# Patient Record
Sex: Female | Born: 1950 | Race: Black or African American | Hispanic: No | Marital: Single | Smoking: Never smoker
Health system: Southern US, Community
[De-identification: ages and names within clinical notes are randomized; demographics above are authoritative.]

## PROBLEM LIST (undated history)

## (undated) DIAGNOSIS — Z9889 Other specified postprocedural states: Secondary | ICD-10-CM

## (undated) DIAGNOSIS — F419 Anxiety disorder, unspecified: Secondary | ICD-10-CM

## (undated) DIAGNOSIS — F32A Depression, unspecified: Secondary | ICD-10-CM

## (undated) DIAGNOSIS — R011 Cardiac murmur, unspecified: Secondary | ICD-10-CM

## (undated) DIAGNOSIS — F329 Major depressive disorder, single episode, unspecified: Secondary | ICD-10-CM

## (undated) DIAGNOSIS — E785 Hyperlipidemia, unspecified: Secondary | ICD-10-CM

## (undated) DIAGNOSIS — E669 Obesity, unspecified: Secondary | ICD-10-CM

## (undated) DIAGNOSIS — I10 Essential (primary) hypertension: Secondary | ICD-10-CM

## (undated) DIAGNOSIS — R112 Nausea with vomiting, unspecified: Secondary | ICD-10-CM

## (undated) DIAGNOSIS — E7800 Pure hypercholesterolemia, unspecified: Secondary | ICD-10-CM

## (undated) DIAGNOSIS — M25562 Pain in left knee: Secondary | ICD-10-CM

## (undated) DIAGNOSIS — M722 Plantar fascial fibromatosis: Secondary | ICD-10-CM

## (undated) DIAGNOSIS — M1990 Unspecified osteoarthritis, unspecified site: Secondary | ICD-10-CM

## (undated) DIAGNOSIS — M25561 Pain in right knee: Secondary | ICD-10-CM

## (undated) HISTORY — DX: Obesity, unspecified: E66.9

## (undated) HISTORY — DX: Essential (primary) hypertension: I10

## (undated) HISTORY — DX: Pain in right knee: M25.562

## (undated) HISTORY — DX: Pure hypercholesterolemia, unspecified: E78.00

## (undated) HISTORY — DX: Major depressive disorder, single episode, unspecified: F32.9

## (undated) HISTORY — DX: Plantar fascial fibromatosis: M72.2

## (undated) HISTORY — DX: Hyperlipidemia, unspecified: E78.5

## (undated) HISTORY — DX: Pain in right knee: M25.561

---

## 1998-10-20 ENCOUNTER — Emergency Department (HOSPITAL_COMMUNITY): Admission: EM | Admit: 1998-10-20 | Discharge: 1998-10-20 | Payer: Self-pay | Admitting: Emergency Medicine

## 2007-04-13 ENCOUNTER — Encounter: Admission: RE | Admit: 2007-04-13 | Discharge: 2007-04-14 | Payer: Self-pay | Admitting: Family Medicine

## 2013-07-13 ENCOUNTER — Ambulatory Visit (INDEPENDENT_AMBULATORY_CARE_PROVIDER_SITE_OTHER): Payer: 59

## 2013-07-13 VITALS — BP 137/75 | HR 68 | Resp 16 | Ht 65.0 in | Wt 220.0 lb

## 2013-07-13 DIAGNOSIS — M779 Enthesopathy, unspecified: Secondary | ICD-10-CM

## 2013-07-13 DIAGNOSIS — M79673 Pain in unspecified foot: Secondary | ICD-10-CM

## 2013-07-13 DIAGNOSIS — M722 Plantar fascial fibromatosis: Secondary | ICD-10-CM

## 2013-07-13 DIAGNOSIS — M79609 Pain in unspecified limb: Secondary | ICD-10-CM

## 2013-07-13 DIAGNOSIS — M2040 Other hammer toe(s) (acquired), unspecified foot: Secondary | ICD-10-CM

## 2013-07-13 DIAGNOSIS — B351 Tinea unguium: Secondary | ICD-10-CM

## 2013-07-13 MED ORDER — EFINACONAZOLE 10 % EX SOLN: CUTANEOUS | Status: DC

## 2013-07-13 NOTE — Progress Notes (Signed)
   Subjective:    Patient ID: Tammy [name], female    DOB: 04/02/1951, 63 y.o.   MRN: [id number]  HPI Comments: "My feet are all messed up"  Patient c/o painful feet bilateral for about 1 year. Her 2nd toes bilateral are curling under and developing callused areas at tip. She keeps 2nd toe right covered with band aids for cushion. She has some callused areas plantar forefoot bilateral. She is concerned about the 2nd toenail left being thick and discolored. Her shoes are uncomfortable. Just needs some recommendations on what she can do.  Foot Pain Associated symptoms include arthralgias and myalgias.      Review of Systems  Musculoskeletal: Positive for arthralgias, gait problem and myalgias.  All other systems reviewed and are negative.       Objective:   Physical Exam Vascular status is intact as follows pedal pulses palpable DP plus one over 4 PT +2/4 bilateral Refill time 3 seconds all digits skin temperature is warm turgor normal no edema rubor pallor or varicosities noted. Dermatologically skin color pigment normal hair growth absent nails are thickened yellow brittle discolored the second toe right has keratosis over the dorsal IP joint due to hammertoe contracture all nails are thickened yellow brittle crumbly nails consistent with onychomycosis also nails are hitting the ends of her shoes she wear shoes 2 short. Recommended a size larger shoe at all times. Neurologically epicritic and proprioceptive sensations intact there may be some pain on direct lateral compression left forefoot cannot rule out some possibly early neuroma situation she is wearing shoes are too tight or compressive versus osteoarthropathy capsulitis. Patient also may have some mild plantar fascial symptomology although not constant. Does have some pain on first up in the morning. Her examination this time reveals patient shoes are not correctly sized and may be causing significant pain discomfort. 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     Assessment & Plan:  Assessment this time onychomycosis nails painful tender symptomatically mycotic nails debrided 1 through 4 bilateral at this time. Recommended topical antifungal a prescription for Tammy [name] is forwarded to the underlying pharmacy. Patient will initiate topical antifungal once daily to the affected nails for 12 months duration at this time to foam padding is applied to the second toe right foot and additional pads are dispensed for use did demonstrate the patient her shoes are 2 short at her toes are hitting the ends the shoes and buckling as result. Recommended knee shoes she may come back and obtain soap extra-depth shoes and she does have significant arthropathy and might benefit from shoes and possibly the customized insoles in the future. May be candidate for functional orthoses as well schedule followup with the next 4 week next  Tammy [name] DPM

## 2013-07-13 NOTE — Patient Instructions (Signed)
Onychomycosis/Fungal Toenails  WHAT IS IT? An infection that lies within the keratin of your nail plate that is caused by a fungus.  WHY ME? Fungal infections affect all ages, sexes, races, and creeds.  There may be many factors that predispose you to a fungal infection such as age, coexisting medical conditions such as diabetes, or an autoimmune disease; stress, medications, fatigue, genetics, etc.  Bottom line: fungus thrives in a warm, moist environment and your shoes offer such a location.  IS IT CONTAGIOUS? Theoretically, yes.  You do not want to share shoes, nail clippers or files with someone who has fungal toenails.  Walking around barefoot in the same room or sleeping in the same bed is unlikely to transfer the organism.  It is important to realize, however, that fungus can spread easily from one nail to the next on the same foot.  HOW DO WE TREAT THIS?  There are several ways to treat this condition.  Treatment may depend on many factors such as age, medications, pregnancy, liver and kidney conditions, etc.  It is best to ask your doctor which options are available to you.  1. No treatment.   Unlike many other medical concerns, you can live with this condition.  However for many people this can be a painful condition and may lead to ingrown toenails or a bacterial infection.  It is recommended that you keep the nails cut short to help reduce the amount of fungal nail. 2. Topical treatment.  These range from herbal remedies to prescription strength nail lacquers.  About 40-50% effective, topicals require twice daily application for approximately 9 to 12 months or until an entirely new nail has grown out.  The most effective topicals are medical grade medications available through physicians offices. 3. Oral antifungal medications.  With an 80-90% cure rate, the most common oral medication requires 3 to 4 months of therapy and stays in your system for a year as the new nail grows out.  Oral  antifungal medications do require blood work to make sure it is a safe drug for you.  A liver function panel will be performed prior to starting the medication and after the first month of treatment.  It is important to have the blood work performed to avoid any harmful side effects.  In general, this medication safe but blood work is required. 4. Laser Therapy.  This treatment is performed by applying a specialized laser to the affected nail plate.  This therapy is noninvasive, fast, and non-painful.  It is not covered by insurance and is therefore, out of pocket.  The results have been very good with a 80-95% cure rate.  The [facility] is the only practice in the area to offer this therapy. 5. Permanent Nail Avulsion.  Removing the entire nail so that a new nail will not grow back.  Your prescription for topical nail antifungal has been forwarded to the underlying pharmacy will be sent T. directly follow the instructions on the card that was given to call the pharmacy number. Apply medication to your toes every day for 12 months duration to  Also followup and get appropriate size shoes comes with longer than your longest toe avoid a tight or restrictive are constricted shoe

## 2013-08-10 ENCOUNTER — Ambulatory Visit: Payer: 59

## 2013-08-13 ENCOUNTER — Ambulatory Visit: Payer: 59

## 2013-12-06 ENCOUNTER — Other Ambulatory Visit: Payer: Self-pay | Admitting: Orthopedic Surgery

## 2013-12-06 NOTE — Progress Notes (Signed)
Preoperative surgical orders have been place into the Epic hospital system for Tammy [name] on 12/06/2013, 10:41 AM  by Patrica DuelPERKINS, Shafiq Larch for surgery on 01/16/2014.  Preop Total Hip - Anterior Approach orders including Experel Injecion, IV Tylenol, and IV Decadron as long as there are no contraindications to the above medications. Avel Peacerew Rickeya Manus, PA-C

## 2014-01-02 ENCOUNTER — Encounter (HOSPITAL_COMMUNITY): Payer: Self-pay | Admitting: Pharmacy Technician

## 2014-01-09 ENCOUNTER — Encounter (INDEPENDENT_AMBULATORY_CARE_PROVIDER_SITE_OTHER): Payer: Self-pay

## 2014-01-09 ENCOUNTER — Encounter (HOSPITAL_COMMUNITY): Payer: Self-pay

## 2014-01-09 ENCOUNTER — Ambulatory Visit (HOSPITAL_COMMUNITY)
Admission: RE | Admit: 2014-01-09 | Discharge: 2014-01-09 | Disposition: A | Discharge status: Home / Self Care | Payer: 59 | Source: Ambulatory Visit | Attending: Orthopedic Surgery | Admitting: Orthopedic Surgery

## 2014-01-09 ENCOUNTER — Encounter (HOSPITAL_COMMUNITY)
Admission: RE | Admit: 2014-01-09 | Discharge: 2014-01-09 | Disposition: A | Discharge status: Home / Self Care | Payer: 59 | Source: Ambulatory Visit | Attending: Orthopedic Surgery | Admitting: Orthopedic Surgery

## 2014-01-09 DIAGNOSIS — M169 Osteoarthritis of hip, unspecified: Secondary | ICD-10-CM | POA: Diagnosis not present

## 2014-01-09 DIAGNOSIS — Z01818 Encounter for other preprocedural examination: Secondary | ICD-10-CM | POA: Insufficient documentation

## 2014-01-09 DIAGNOSIS — M1610 Unilateral primary osteoarthritis, unspecified hip: Secondary | ICD-10-CM | POA: Diagnosis not present

## 2014-01-09 DIAGNOSIS — I10 Essential (primary) hypertension: Secondary | ICD-10-CM | POA: Diagnosis not present

## 2014-01-09 HISTORY — DX: Unspecified osteoarthritis, unspecified site: M19.90

## 2014-01-09 HISTORY — DX: Cardiac murmur, unspecified: R01.1

## 2014-01-09 HISTORY — DX: Major depressive disorder, single episode, unspecified: F32.9

## 2014-01-09 HISTORY — DX: Depression, unspecified: F32.A

## 2014-01-09 LAB — CBC
HEMATOCRIT: 38.9 % (ref 36.0–46.0)
HEMOGLOBIN: 12.9 g/dL (ref 12.0–15.0)
MCH: 29.3 pg (ref 26.0–34.0)
MCHC: 33.2 g/dL (ref 30.0–36.0)
MCV: 88.4 fL (ref 78.0–100.0)
Platelets: 352 10*3/uL (ref 150–400)
RBC: 4.4 MIL/uL (ref 3.87–5.11)
RDW: 13.9 % (ref 11.5–15.5)
WBC: 14 10*3/uL — ABNORMAL HIGH (ref 4.0–10.5)

## 2014-01-09 LAB — SURGICAL PCR SCREEN
MRSA, PCR: NEGATIVE
Staphylococcus aureus: NEGATIVE

## 2014-01-09 LAB — COMPREHENSIVE METABOLIC PANEL
ALK PHOS: 117 U/L (ref 39–117)
ALT: 20 U/L (ref 0–35)
ANION GAP: 14 (ref 5–15)
AST: 24 U/L (ref 0–37)
Albumin: 4.1 g/dL (ref 3.5–5.2)
BILIRUBIN TOTAL: 0.8 mg/dL (ref 0.3–1.2)
BUN: 14 mg/dL (ref 6–23)
CO2: 26 mEq/L (ref 19–32)
CREATININE: 0.71 mg/dL (ref 0.50–1.10)
Calcium: 9.7 mg/dL (ref 8.4–10.5)
Chloride: 96 mEq/L (ref 96–112)
GFR calc non Af Amer: >90 mL/min (ref >90)
GLUCOSE: 87 mg/dL (ref 70–99)
POTASSIUM: 3.2 meq/L — AB (ref 3.7–5.3)
Sodium: 136 mEq/L — ABNORMAL LOW (ref 137–147)
TOTAL PROTEIN: 7.9 g/dL (ref 6.0–8.3)

## 2014-01-09 LAB — URINALYSIS, ROUTINE W REFLEX MICROSCOPIC
Glucose, UA: NEGATIVE mg/dL
HGB URINE DIPSTICK: NEGATIVE
Ketones, ur: NEGATIVE mg/dL
Nitrite: NEGATIVE
PROTEIN: NEGATIVE mg/dL
Specific Gravity, Urine: 1.024 (ref 1.005–1.030)
UROBILINOGEN UA: 0.2 mg/dL (ref 0.0–1.0)
pH: 5.5 (ref 5.0–8.0)

## 2014-01-09 LAB — APTT: APTT: 40 s — AB (ref 24–37)

## 2014-01-09 LAB — URINE MICROSCOPIC-ADD ON

## 2014-01-09 LAB — PROTIME-INR
INR: 1.08 (ref 0.00–1.49)
PROTHROMBIN TIME: 14 s (ref 11.6–15.2)

## 2014-01-09 NOTE — Patient Instructions (Addendum)
20 Tammy [name]  01/09/2014   Your procedure is scheduled on:   9-16 -2015  Report to [facility] and follow signs to  Short Stay Center at AM. Arrive at 7:45 AM  Call this number if you have problems the morning of surgery [phone] or Presurgical Testing [phone].   Remember:  Do not eat food or drink liquids :After Midnight.  For Living Will and/or Health Care Power Attorney Forms: please provide copy for your medical record, may bring AM of surgery (forms should be already notarized-we do not provide this service).    Take these medicines the morning of surgery with A SIP OF WATER: Metoprolol,Tramadol if needed for pain,Plendil                               You may not have any metal on your body including hair pins and piercings  Do not wear jewelry, make-up, lotions, powders, or deodorant.  Do not shave body hair  48 hours(2 days) of CHG soap use. Men may shave face and neck.               Do not bring valuables to the hospital. [facility] IS NOT RESPONSIBLE FOR VALUABLES.  Contacts, dentures or bridgework may not be worn into surgery.  Leave suitcase in the car. After surgery it may be brought to your room.  For patients admitted to the hospital, checkout time is 11:00 AM the day of discharge.     Special Instructions: review fact sheets for MRSA information, Blood Transfusion fact sheet, Incentive Spirometry.  Remember: Type/Screen "Blue armsbands"- may not be removed once applied(would result in being retested AM of surgery, if removed). ________________________________________________________________________  [facility] - Preparing for Surgery Before surgery, you can play an important role.  Because skin is not sterile, your skin needs to be as free of germs as possible.  You can reduce the number of germs on your skin by washing with CHG (chlorahexidine gluconate) soap before surgery.  CHG is an antiseptic cleaner which kills germs and bonds  with the skin to continue killing germs even after washing. Please DO NOT use if you have an allergy to CHG or antibacterial soaps.  If your skin becomes reddened/irritated stop using the CHG and inform your nurse when you arrive at Short Stay. Do not shave (including legs and underarms) for at least 48 hours prior to the first CHG shower.  You may shave your face/neck. Please follow these instructions carefully:  1.  Shower with CHG Soap the night before surgery and the  morning of Surgery.  2.  If you choose to wash your hair, wash your hair first as usual with your  normal  shampoo.  3.  After you shampoo, rinse your hair and body thoroughly to remove the  shampoo.                           4.  Use CHG as you would any other liquid soap.  You can apply chg directly  to the skin and wash                       Gently with a scrungie or clean washcloth.  5.  Apply the CHG Soap to your body ONLY FROM THE NECK DOWN.   Do not use on face/ open  Wound or open sores. Avoid contact with eyes, ears mouth and genitals (private parts).                       Wash face,  Genitals (private parts) with your normal soap.             6.  Wash thoroughly, paying special attention to the area where your surgery  will be performed.  7.  Thoroughly rinse your body with warm water from the neck down.  8.  DO NOT shower/wash with your normal soap after using and rinsing off  the CHG Soap.                9.  Pat yourself dry with a clean towel.            10.  Wear clean pajamas.            11.  Place clean sheets on your bed the night of your first shower and do not  sleep with pets. Day of Surgery : Do not apply any lotions/deodorants the morning of surgery.  Please wear clean clothes to the hospital/surgery center.  FAILURE TO FOLLOW THESE INSTRUCTIONS MAY RESULT IN THE CANCELLATION OF YOUR SURGERY PATIENT SIGNATURE_________________________________  NURSE  SIGNATURE__________________________________  ________________________________________________________________________   Adam Phenix  An incentive spirometer is a tool that can help keep your lungs clear and active. This tool measures how well you are filling your lungs with each breath. Taking long deep breaths may help reverse or decrease the chance of developing breathing (pulmonary) problems (especially infection) following:  A long period of time when you are unable to move or be active. BEFORE THE PROCEDURE   If the spirometer includes an indicator to show your best effort, your nurse or respiratory therapist will set it to a desired goal.  If possible, sit up straight or lean slightly forward. Try not to slouch.  Hold the incentive spirometer in an upright position. INSTRUCTIONS FOR USE  1. Sit on the edge of your bed if possible, or sit up as far as you can in bed or on a chair. 2. Hold the incentive spirometer in an upright position. 3. Breathe out normally. 4. Place the mouthpiece in your mouth and seal your lips tightly around it. 5. Breathe in slowly and as deeply as possible, raising the piston or the ball toward the top of the column. 6. Hold your breath for 3-5 seconds or for as long as possible. Allow the piston or ball to fall to the bottom of the column. 7. Remove the mouthpiece from your mouth and breathe out normally. 8. Rest for a few seconds and repeat Steps 1 through 7 at least 10 times every 1-2 hours when you are awake. Take your time and take a few normal breaths between deep breaths. 9. The spirometer may include an indicator to show your best effort. Use the indicator as a goal to work toward during each repetition. 10. After each set of 10 deep breaths, practice coughing to be sure your lungs are clear. If you have an incision (the cut made at the time of surgery), support your incision when coughing by placing a pillow or rolled up towels firmly  against it. Once you are able to get out of bed, walk around indoors and cough well. You may stop using the incentive spirometer when instructed by your caregiver.  RISKS AND COMPLICATIONS  Take your time so you do not  get dizzy or light-headed.  If you are in pain, you may need to take or ask for pain medication before doing incentive spirometry. It is harder to take a deep breath if you are having pain. AFTER USE  Rest and breathe slowly and easily.  It can be helpful to keep track of a log of your progress. Your caregiver can provide you with a simple table to help with this. If you are using the spirometer at home, follow these instructions: [place] IF:   You are having difficultly using the spirometer.  You have trouble using the spirometer as often as instructed.  Your pain medication is not giving enough relief while using the spirometer.  You develop fever of 100.5 F (38.1 C) or higher. SEEK IMMEDIATE MEDICAL CARE IF:   You cough up bloody sputum that had not been present before.  You develop fever of 102 F (38.9 C) or greater.  You develop worsening pain at or near the incision site. MAKE SURE YOU:   Understand these instructions.  Will watch your condition.  Will get help right away if you are not doing well or get worse. Document Released: 08/30/2006 Document Revised: 07/12/2011 Document Reviewed: 10/31/2006 Exit[employer] Patient Information 2014 [employer], [place].   ________________________________________________________________________  WHAT IS A BLOOD TRANSFUSION? Blood Transfusion Information  A transfusion is the replacement of blood or some of its parts. Blood is made up of multiple cells which provide different functions.  Red blood cells carry oxygen and are used for blood loss replacement.  White blood cells fight against infection.  Platelets control bleeding.  Plasma helps clot blood.  Other blood products are available for  specialized needs, such as hemophilia or other clotting disorders. BEFORE THE TRANSFUSION  Who gives blood for transfusions?   Healthy volunteers who are fully evaluated to make sure their blood is safe. This is blood bank blood. Transfusion therapy is the safest it has ever been in the practice of medicine. Before blood is taken from a donor, a complete history is taken to make sure that person has no history of diseases nor engages in risky social behavior (examples are intravenous drug use or sexual activity with multiple partners). The donor's travel history is screened to minimize risk of transmitting infections, such as malaria. The donated blood is tested for signs of infectious diseases, such as HIV and hepatitis. The blood is then tested to be sure it is compatible with you in order to minimize the chance of a transfusion reaction. If you or a relative donates blood, this is often done in anticipation of surgery and is not appropriate for emergency situations. It takes many days to process the donated blood. RISKS AND COMPLICATIONS Although transfusion therapy is very safe and saves many lives, the main dangers of transfusion include:   Getting an infectious disease.  Developing a transfusion reaction. This is an allergic reaction to something in the blood you were given. Every precaution is taken to prevent this. The decision to have a blood transfusion has been considered carefully by your caregiver before blood is given. Blood is not given unless the benefits outweigh the risks. AFTER THE TRANSFUSION  Right after receiving a blood transfusion, you will usually feel much better and more energetic. This is especially true if your red blood cells have gotten low (anemic). The transfusion raises the level of the red blood cells which carry oxygen, and this usually causes an energy increase.  The nurse administering the transfusion will  monitor you carefully for complications. HOME CARE  INSTRUCTIONS  No special instructions are needed after a transfusion. You may find your energy is better. Speak with your caregiver about any limitations on activity for underlying diseases you may have. SEEK MEDICAL CARE IF:   Your condition is not improving after your transfusion.  You develop redness or irritation at the intravenous (IV) site. SEEK IMMEDIATE MEDICAL CARE IF:  Any of the following symptoms occur over the next 12 hours:  Shaking chills.  You have a temperature by mouth above 102 F (38.9 C), not controlled by medicine.  Chest, back, or muscle pain.  People around you feel you are not acting correctly or are confused.  Shortness of breath or difficulty breathing.  Dizziness and fainting.  You get a rash or develop hives.  You have a decrease in urine output.  Your urine turns a dark color or changes to pink, red, or brown. Any of the following symptoms occur over the next 10 days:  You have a temperature by mouth above 102 F (38.9 C), not controlled by medicine.  Shortness of breath.  Weakness after normal activity.  The white part of the eye turns yellow (jaundice).  You have a decrease in the amount of urine or are urinating less often.  Your urine turns a dark color or changes to pink, red, or brown. Document Released: 04/16/2000 Document Revised: 07/12/2011 Document Reviewed: 12/04/2007 [facility] Patient Information 2014 [place], [place].  _______________________________________________________________________

## 2014-01-10 NOTE — Pre-Procedure Instructions (Signed)
01-10-14 Labs viewable of 01-09-14 Epic-please note WBC, Potassium, urinalysis. Faxed note to Dr. Deri Fuelling office [phone].

## 2014-01-15 ENCOUNTER — Other Ambulatory Visit: Payer: Self-pay | Admitting: Orthopedic Surgery

## 2014-01-15 NOTE — H&P (Signed)
Efraim Kaufmann DOB: [birth year] Divorced / Language: [place] / Race: Black or African American, White Female Date of Admission:  01/16/2014 Chief Complaint:  Left Hip pain History of Present Illness The patient is a 63 year old female who comes in  for a preoperative history and physical. The patient is scheduled for a left total hip arthroplasty (anterior approach) to be performed by Dr. Gus Rankin. Aluisio, MD at [facility] on 01/16/2014. The patient is a 63 year old female who presents for follow up of their hip. The patient is being followed for their left hip pain and osteoarthritis. They are several months out from intra-articular injection. Symptoms reported today include: pain, aching and difficulty ambulating. and report their pain level to be moderate. Current treatment includes: NSAIDs (Mobic). The patient has reported improvement of their symptoms with: Cortisone injections. Unfortunately her hip is getting progressively worse. She has pain at all times. It limits what she can and cannot do. She had been compensating well until recently. She is at a stage now where she is ready to go ahead and get the left hip fixed. The injection did relieve some of the intense pain, but the pain is still present at all times. She is ready to proceed with hip surgery. They have been treated conservatively in the past for the above stated problem and despite conservative measures, they continue to have progressive pain and severe functional limitations and dysfunction. They have failed non-operative management including home exercise, medications, and injections. It is felt that they would benefit from undergoing total joint replacement. Risks and benefits of the procedure have been discussed with the patient and they elect to proceed with surgery. There are no active contraindications to surgery such as ongoing infection or rapidly progressive neurological disease.  Problem List/Past Medical  Hip  osteoarthritis (715.95  M16.9) High blood pressure Hypercholesterolemia Anxiety Disorder Depression Heart murmur  Allergies  No Known Drug Allergies  Family History Cancer Maternal Grandfather, Mother. Cerebrovascular Accident Maternal Grandmother. Diabetes Mellitus Sister. Hypertension Maternal Grandmother, Sister.  Social History Living situation live alone Marital status divorced Children 2 Exercise Exercises never Current work status working full time No history of drug/alcohol rehab Tobacco use Never smoker. 05/17/2013 Number of flights of stairs before winded 2-3 Never consumed alcohol 05/17/2013: Never consumed alcohol Not under pain contract Post-Surgical Plans Home versus SNF depending upon her progress.  Medication History TraMADol HCl (  Tablet, 1-2 Tablet Oral every 6-8 hours as needed for pain, Taken starting 12/25/2013) Active. Naproxen Sodium (  Tablet, Oral) Active. (bid) Metoprolol Succinate ER (  Tablet ER 24HR, Oral) Active. Felodipine ER (  Tablet ER 24HR, Oral) Active. Atorvastatin Calcium (  Tablet, Oral) Active. Lexapro (  Tablet, Oral) Active. Lisinopril-Hydrochlorothiazide (20-12.5MG  Tablet, Oral) Active. (BID) Aspirin EC (  Tablet DR, Oral) Active.   Past Surgical History No pertinent past surgical history   Review of Systems General Not Present- Chills, Fatigue, Fever, Memory Loss, Night Sweats, Weight Gain and Weight Loss. Skin Not Present- Eczema, Hives, Itching, Lesions and Rash. HEENT Not Present- Dentures, Double Vision, Headache, Hearing Loss, Tinnitus and Visual Loss. Respiratory Not Present- Allergies, Chronic Cough, Coughing up blood, Shortness of breath at rest and Shortness of breath with exertion. Cardiovascular Not Present- Chest Pain, Difficulty Breathing Lying Down, Murmur, Palpitations, Racing/skipping heartbeats and Swelling. Gastrointestinal Not Present- Abdominal Pain,  Bloody Stool, Constipation, Diarrhea, Difficulty Swallowing, Heartburn, Jaundice, Loss of appetitie, Nausea and Vomiting. Female Genitourinary Not Present- Blood in Urine, Discharge, Flank Pain, Incontinence, Painful Urination, Urgency, Urinary  frequency, Urinary Retention, Urinating at Night and Weak urinary stream. Musculoskeletal Present- Back Pain, Morning Stiffness, Muscle Pain and Spasms. Not Present- Joint Pain, Joint Swelling and Muscle Weakness. Neurological Not Present- Blackout spells, Difficulty with balance, Dizziness, Paralysis, Tremor and Weakness. Psychiatric Not Present- Insomnia.   Vitals Pulse: 84 (Regular)  Resp.: 16 (Unlabored)  BP: 152/92 (Sitting, Right Arm, Standard)   Physical Exam General Mental Status -Alert, cooperative and good historian. General Appearance-pleasant, Not in acute distress. Orientation-Oriented X3. Build & Nutrition-Well nourished and Well developed.  Head and Neck Head-normocephalic, atraumatic . Neck Global Assessment - supple, no bruit auscultated on the right, no bruit auscultated on the left.  Eye Vision-Wears corrective lenses. Pupil - Bilateral-Regular and Round. Motion - Bilateral-EOMI.  Chest and Lung Exam Auscultation Breath sounds - clear at anterior chest wall and clear at posterior chest wall. Adventitious sounds - No Adventitious sounds.  Cardiovascular Auscultation Rhythm - Regular rate and rhythm. Heart Sounds - S1 WNL and S2 WNL. Murmurs & Other Heart Sounds - Auscultation of the heart reveals - No Murmurs.  Abdomen Palpation/Percussion Tenderness - Abdomen is non-tender to palpation. Rigidity (guarding) - Abdomen is soft. Auscultation Auscultation of the abdomen reveals - Bowel sounds normal.  Female Genitourinary Note: Not done, not pertinent to present illness   Musculoskeletal Note: She is alert and oriented in no apparent distress. The left hip can be flexed to 85 with no internal  or external rotation, only about 5 degrees of abduction. The right hip flexes to about 110 with about 5 degrees of internal and external rotation, about 20 degrees of abduction.  RADIOGRAPHS: AP pelvis and lateral of the hip. 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  Assessment & Plan Osteoarthritis of left hip (715.95  M16.12) Impression: Left Hip Note:Plan is for a Left Total Hip Replacement - Anterior Approach by Dr. Aluisio.  Plan is to go home versus SNF.  PCP - Dr. Vyvan Sun  The patient does not have any contraindications and will receive TXA (tranexamic acid) prior to surgery.  Signed electronically by Alexzandrew L Perkins, III PA-C 

## 2014-01-16 ENCOUNTER — Inpatient Hospital Stay (HOSPITAL_COMMUNITY)
Admission: RE | Admit: 2014-01-16 | Discharge: 2014-01-18 | DRG: 470 | Disposition: A | Discharge status: Home Health | Payer: 59 | Source: Ambulatory Visit | Attending: Orthopedic Surgery | Admitting: Orthopedic Surgery

## 2014-01-16 ENCOUNTER — Encounter (HOSPITAL_COMMUNITY): Payer: Self-pay | Admitting: *Deleted

## 2014-01-16 ENCOUNTER — Inpatient Hospital Stay (HOSPITAL_COMMUNITY): Payer: 59

## 2014-01-16 ENCOUNTER — Encounter (HOSPITAL_COMMUNITY)
Admission: RE | Disposition: A | Discharge status: Home Health | Payer: Self-pay | Source: Ambulatory Visit | Attending: Orthopedic Surgery

## 2014-01-16 ENCOUNTER — Inpatient Hospital Stay (HOSPITAL_COMMUNITY): Payer: 59 | Admitting: Anesthesiology

## 2014-01-16 ENCOUNTER — Encounter (HOSPITAL_COMMUNITY): Payer: 59 | Admitting: Anesthesiology

## 2014-01-16 DIAGNOSIS — M169 Osteoarthritis of hip, unspecified: Secondary | ICD-10-CM | POA: Diagnosis present

## 2014-01-16 DIAGNOSIS — Z823 Family history of stroke: Secondary | ICD-10-CM | POA: Diagnosis not present

## 2014-01-16 DIAGNOSIS — Z809 Family history of malignant neoplasm, unspecified: Secondary | ICD-10-CM | POA: Diagnosis not present

## 2014-01-16 DIAGNOSIS — F329 Major depressive disorder, single episode, unspecified: Secondary | ICD-10-CM | POA: Diagnosis present

## 2014-01-16 DIAGNOSIS — M1612 Unilateral primary osteoarthritis, left hip: Secondary | ICD-10-CM

## 2014-01-16 DIAGNOSIS — Z791 Long term (current) use of non-steroidal anti-inflammatories (NSAID): Secondary | ICD-10-CM

## 2014-01-16 DIAGNOSIS — E785 Hyperlipidemia, unspecified: Secondary | ICD-10-CM | POA: Diagnosis present

## 2014-01-16 DIAGNOSIS — M247 Protrusio acetabuli: Secondary | ICD-10-CM | POA: Diagnosis present

## 2014-01-16 DIAGNOSIS — F3289 Other specified depressive episodes: Secondary | ICD-10-CM | POA: Diagnosis present

## 2014-01-16 DIAGNOSIS — M76899 Other specified enthesopathies of unspecified lower limb, excluding foot: Secondary | ICD-10-CM | POA: Diagnosis present

## 2014-01-16 DIAGNOSIS — Z833 Family history of diabetes mellitus: Secondary | ICD-10-CM

## 2014-01-16 DIAGNOSIS — R011 Cardiac murmur, unspecified: Secondary | ICD-10-CM | POA: Diagnosis present

## 2014-01-16 DIAGNOSIS — M1610 Unilateral primary osteoarthritis, unspecified hip: Principal | ICD-10-CM | POA: Diagnosis present

## 2014-01-16 DIAGNOSIS — I10 Essential (primary) hypertension: Secondary | ICD-10-CM | POA: Diagnosis present

## 2014-01-16 DIAGNOSIS — F411 Generalized anxiety disorder: Secondary | ICD-10-CM | POA: Diagnosis present

## 2014-01-16 DIAGNOSIS — Z8249 Family history of ischemic heart disease and other diseases of the circulatory system: Secondary | ICD-10-CM | POA: Diagnosis not present

## 2014-01-16 DIAGNOSIS — Z7982 Long term (current) use of aspirin: Secondary | ICD-10-CM

## 2014-01-16 DIAGNOSIS — M25559 Pain in unspecified hip: Secondary | ICD-10-CM | POA: Diagnosis present

## 2014-01-16 DIAGNOSIS — Z79899 Other long term (current) drug therapy: Secondary | ICD-10-CM

## 2014-01-16 DIAGNOSIS — E7800 Pure hypercholesterolemia, unspecified: Secondary | ICD-10-CM | POA: Diagnosis present

## 2014-01-16 HISTORY — PX: TOTAL HIP ARTHROPLASTY: SHX124

## 2014-01-16 LAB — TYPE AND SCREEN
ABO/RH(D): O POS
Antibody Screen: NEGATIVE

## 2014-01-16 LAB — ABO/RH: ABO/RH(D): O POS

## 2014-01-16 SURGERY — ARTHROPLASTY, HIP, TOTAL, ANTERIOR APPROACH
Anesthesia: General | Site: Hip | Laterality: Left

## 2014-01-16 MED ORDER — ONDANSETRON HCL 4 MG PO TABS: 4.0000 mg | ORAL_TABLET | Freq: Four times a day (QID) | ORAL | Status: DC | PRN

## 2014-01-16 MED ORDER — SODIUM CHLORIDE 0.9 % IJ SOLN
INTRAMUSCULAR | Status: DC | PRN
  Administered 2014-01-16: 30 mL via INTRAVENOUS

## 2014-01-16 MED ORDER — METOCLOPRAMIDE HCL 10 MG PO TABS: 5.0000 mg | ORAL_TABLET | Freq: Three times a day (TID) | ORAL | Status: DC | PRN

## 2014-01-16 MED ORDER — 0.9 % SODIUM CHLORIDE (POUR BTL) OPTIME
TOPICAL | Status: DC | PRN
  Administered 2014-01-16: 1000 mL

## 2014-01-16 MED ORDER — FELODIPINE ER 10 MG PO TB24
10.0000 mg | ORAL_TABLET | Freq: Every day | ORAL | Status: DC
  Administered 2014-01-17 – 2014-01-18 (×2): 10 mg via ORAL
  Filled 2014-01-16 (×2): qty 1

## 2014-01-16 MED ORDER — DOCUSATE SODIUM 100 MG PO CAPS
100.0000 mg | ORAL_CAPSULE | Freq: Two times a day (BID) | ORAL | Status: DC
  Administered 2014-01-16 – 2014-01-18 (×4): 100 mg via ORAL

## 2014-01-16 MED ORDER — BUPIVACAINE HCL (PF) 0.25 % IJ SOLN
INTRAMUSCULAR | Status: DC | PRN
  Administered 2014-01-16: 30 mL

## 2014-01-16 MED ORDER — CEFAZOLIN SODIUM-DEXTROSE 2-3 GM-% IV SOLR
INTRAVENOUS | Status: AC
  Filled 2014-01-16: qty 50

## 2014-01-16 MED ORDER — DEXAMETHASONE SODIUM PHOSPHATE 10 MG/ML IJ SOLN
INTRAMUSCULAR | Status: DC | PRN
  Administered 2014-01-16: 10 mg via INTRAVENOUS

## 2014-01-16 MED ORDER — POLYETHYLENE GLYCOL 3350 17 G PO PACK: 17.0000 g | PACK | Freq: Every day | ORAL | Status: DC | PRN

## 2014-01-16 MED ORDER — BUPIVACAINE LIPOSOME 1.3 % IJ SUSP
INTRAMUSCULAR | Status: DC | PRN
  Administered 2014-01-16: 20 mL

## 2014-01-16 MED ORDER — BISACODYL 10 MG RE SUPP: 10.0000 mg | Freq: Every day | RECTAL | Status: DC | PRN

## 2014-01-16 MED ORDER — FENTANYL CITRATE 0.05 MG/ML IJ SOLN: 25.0000 ug | INTRAMUSCULAR | Status: DC | PRN

## 2014-01-16 MED ORDER — ACETAMINOPHEN 500 MG PO TABS
1000.0000 mg | ORAL_TABLET | Freq: Four times a day (QID) | ORAL | Status: AC
  Administered 2014-01-16 – 2014-01-17 (×4): 1000 mg via ORAL
  Filled 2014-01-16 (×4): qty 2

## 2014-01-16 MED ORDER — LIDOCAINE HCL (CARDIAC) 20 MG/ML IV SOLN
INTRAVENOUS | Status: AC
  Filled 2014-01-16: qty 5

## 2014-01-16 MED ORDER — KETOROLAC TROMETHAMINE 15 MG/ML IJ SOLN: 7.5000 mg | Freq: Four times a day (QID) | INTRAMUSCULAR | Status: AC | PRN

## 2014-01-16 MED ORDER — MENTHOL 3 MG MT LOZG
1.0000 | LOZENGE | OROMUCOSAL | Status: DC | PRN
  Filled 2014-01-16: qty 9

## 2014-01-16 MED ORDER — DIPHENHYDRAMINE HCL 12.5 MG/5ML PO ELIX: 12.5000 mg | ORAL_SOLUTION | ORAL | Status: DC | PRN

## 2014-01-16 MED ORDER — GLYCOPYRROLATE 0.2 MG/ML IJ SOLN
INTRAMUSCULAR | Status: AC
  Filled 2014-01-16: qty 2

## 2014-01-16 MED ORDER — OXYCODONE HCL 5 MG PO TABS
5.0000 mg | ORAL_TABLET | ORAL | Status: DC | PRN
  Administered 2014-01-16 – 2014-01-18 (×9): 5 mg via ORAL
  Filled 2014-01-16 (×4): qty 1
  Filled 2014-01-16: qty 2
  Filled 2014-01-16 (×4): qty 1

## 2014-01-16 MED ORDER — RIVAROXABAN 10 MG PO TABS
10.0000 mg | ORAL_TABLET | Freq: Every day | ORAL | Status: DC
  Administered 2014-01-17 – 2014-01-18 (×2): 10 mg via ORAL
  Filled 2014-01-16 (×3): qty 1

## 2014-01-16 MED ORDER — ACETAMINOPHEN 10 MG/ML IV SOLN
1000.0000 mg | Freq: Once | INTRAVENOUS | Status: AC
  Administered 2014-01-16: 1000 mg via INTRAVENOUS
  Filled 2014-01-16: qty 100

## 2014-01-16 MED ORDER — SODIUM CHLORIDE 0.9 % IJ SOLN
INTRAMUSCULAR | Status: AC
  Filled 2014-01-16: qty 50

## 2014-01-16 MED ORDER — PROMETHAZINE HCL 25 MG/ML IJ SOLN: 6.2500 mg | INTRAMUSCULAR | Status: DC | PRN

## 2014-01-16 MED ORDER — ROCURONIUM BROMIDE 100 MG/10ML IV SOLN
INTRAVENOUS | Status: DC | PRN
  Administered 2014-01-16: 10 mg via INTRAVENOUS
  Administered 2014-01-16: 30 mg via INTRAVENOUS

## 2014-01-16 MED ORDER — NEOSTIGMINE METHYLSULFATE 10 MG/10ML IV SOLN
INTRAVENOUS | Status: DC | PRN
  Administered 2014-01-16: 3 mg via INTRAVENOUS

## 2014-01-16 MED ORDER — METOPROLOL SUCCINATE ER 25 MG PO TB24
25.0000 mg | ORAL_TABLET | Freq: Every evening | ORAL | Status: DC
  Administered 2014-01-17: 25 mg via ORAL
  Filled 2014-01-16 (×2): qty 1

## 2014-01-16 MED ORDER — GLYCOPYRROLATE 0.2 MG/ML IJ SOLN
INTRAMUSCULAR | Status: DC | PRN
  Administered 2014-01-16: 0.4 mg via INTRAVENOUS

## 2014-01-16 MED ORDER — FENTANYL CITRATE 0.05 MG/ML IJ SOLN
INTRAMUSCULAR | Status: AC
  Filled 2014-01-16: qty 2

## 2014-01-16 MED ORDER — ATORVASTATIN CALCIUM 40 MG PO TABS
40.0000 mg | ORAL_TABLET | Freq: Every day | ORAL | Status: DC
  Administered 2014-01-16 – 2014-01-17 (×2): 40 mg via ORAL
  Filled 2014-01-16 (×3): qty 1

## 2014-01-16 MED ORDER — ONDANSETRON HCL 4 MG/2ML IJ SOLN: 4.0000 mg | Freq: Four times a day (QID) | INTRAMUSCULAR | Status: DC | PRN

## 2014-01-16 MED ORDER — MIDAZOLAM HCL 2 MG/2ML IJ SOLN
INTRAMUSCULAR | Status: AC
  Filled 2014-01-16: qty 2

## 2014-01-16 MED ORDER — ACETAMINOPHEN 650 MG RE SUPP: 650.0000 mg | Freq: Four times a day (QID) | RECTAL | Status: DC | PRN

## 2014-01-16 MED ORDER — DEXAMETHASONE 6 MG PO TABS
10.0000 mg | ORAL_TABLET | Freq: Every day | ORAL | Status: AC
  Administered 2014-01-17: 10 mg via ORAL
  Filled 2014-01-16: qty 1

## 2014-01-16 MED ORDER — ONDANSETRON HCL 4 MG/2ML IJ SOLN
INTRAMUSCULAR | Status: AC
  Filled 2014-01-16: qty 2

## 2014-01-16 MED ORDER — FENTANYL CITRATE 0.05 MG/ML IJ SOLN
INTRAMUSCULAR | Status: DC | PRN
  Administered 2014-01-16: 50 ug via INTRAVENOUS
  Administered 2014-01-16: 25 ug via INTRAVENOUS
  Administered 2014-01-16: 50 ug via INTRAVENOUS
  Administered 2014-01-16: 25 ug via INTRAVENOUS
  Administered 2014-01-16 (×2): 50 ug via INTRAVENOUS

## 2014-01-16 MED ORDER — CHLORHEXIDINE GLUCONATE 4 % EX LIQD: 60.0000 mL | Freq: Once | CUTANEOUS | Status: DC

## 2014-01-16 MED ORDER — ONDANSETRON HCL 4 MG/2ML IJ SOLN: INTRAMUSCULAR | Status: DC | PRN

## 2014-01-16 MED ORDER — HYDROMORPHONE HCL 1 MG/ML IJ SOLN
INTRAMUSCULAR | Status: AC
  Filled 2014-01-16: qty 1

## 2014-01-16 MED ORDER — CEFAZOLIN SODIUM-DEXTROSE 2-3 GM-% IV SOLR
2.0000 g | Freq: Four times a day (QID) | INTRAVENOUS | Status: AC
  Administered 2014-01-16 (×2): 2 g via INTRAVENOUS
  Filled 2014-01-16 (×2): qty 50

## 2014-01-16 MED ORDER — PROPOFOL 10 MG/ML IV BOLUS
INTRAVENOUS | Status: AC
  Filled 2014-01-16: qty 20

## 2014-01-16 MED ORDER — METHOCARBAMOL 500 MG PO TABS: 500.0000 mg | ORAL_TABLET | Freq: Four times a day (QID) | ORAL | Status: DC | PRN

## 2014-01-16 MED ORDER — FLEET ENEMA 7-19 GM/118ML RE ENEM: 1.0000 | ENEMA | Freq: Once | RECTAL | Status: AC | PRN

## 2014-01-16 MED ORDER — LIDOCAINE HCL (CARDIAC) 20 MG/ML IV SOLN
INTRAVENOUS | Status: DC | PRN
  Administered 2014-01-16: 100 mg via INTRAVENOUS

## 2014-01-16 MED ORDER — NEOSTIGMINE METHYLSULFATE 10 MG/10ML IV SOLN
INTRAVENOUS | Status: AC
  Filled 2014-01-16: qty 1

## 2014-01-16 MED ORDER — LACTATED RINGERS IV SOLN
INTRAVENOUS | Status: DC
  Administered 2014-01-16: 1000 mL via INTRAVENOUS
  Administered 2014-01-16: 12:00:00 via INTRAVENOUS

## 2014-01-16 MED ORDER — DEXAMETHASONE SODIUM PHOSPHATE 10 MG/ML IJ SOLN: 10.0000 mg | Freq: Once | INTRAMUSCULAR | Status: DC

## 2014-01-16 MED ORDER — METOCLOPRAMIDE HCL 5 MG/ML IJ SOLN: 5.0000 mg | Freq: Three times a day (TID) | INTRAMUSCULAR | Status: DC | PRN

## 2014-01-16 MED ORDER — KCL IN DEXTROSE-NACL 20-5-0.9 MEQ/L-%-% IV SOLN
INTRAVENOUS | Status: DC
  Administered 2014-01-16 – 2014-01-17 (×2): via INTRAVENOUS
  Filled 2014-01-16 (×4): qty 1000

## 2014-01-16 MED ORDER — MORPHINE SULFATE 2 MG/ML IJ SOLN: 1.0000 mg | INTRAMUSCULAR | Status: DC | PRN

## 2014-01-16 MED ORDER — FENTANYL CITRATE 0.05 MG/ML IJ SOLN
50.0000 ug | INTRAMUSCULAR | Status: DC | PRN
  Administered 2014-01-16 (×2): 50 ug via INTRAVENOUS

## 2014-01-16 MED ORDER — DEXAMETHASONE SODIUM PHOSPHATE 10 MG/ML IJ SOLN
10.0000 mg | Freq: Every day | INTRAMUSCULAR | Status: AC
  Filled 2014-01-16: qty 1

## 2014-01-16 MED ORDER — TRANEXAMIC ACID 100 MG/ML IV SOLN
1000.0000 mg | INTRAVENOUS | Status: AC
  Administered 2014-01-16: 1000 mg via INTRAVENOUS
  Filled 2014-01-16: qty 10

## 2014-01-16 MED ORDER — PROPOFOL 10 MG/ML IV BOLUS
INTRAVENOUS | Status: DC | PRN
  Administered 2014-01-16: 200 mg via INTRAVENOUS

## 2014-01-16 MED ORDER — BUPIVACAINE-EPINEPHRINE (PF) 0.25% -1:200000 IJ SOLN
INTRAMUSCULAR | Status: AC
  Filled 2014-01-16: qty 30

## 2014-01-16 MED ORDER — METHOCARBAMOL 1000 MG/10ML IJ SOLN
500.0000 mg | Freq: Four times a day (QID) | INTRAVENOUS | Status: DC | PRN
  Administered 2014-01-16: 500 mg via INTRAVENOUS
  Filled 2014-01-16: qty 5

## 2014-01-16 MED ORDER — ONDANSETRON HCL 4 MG/2ML IJ SOLN
INTRAMUSCULAR | Status: DC | PRN
  Administered 2014-01-16: 4 mg via INTRAVENOUS

## 2014-01-16 MED ORDER — PHENOL 1.4 % MT LIQD
1.0000 | OROMUCOSAL | Status: DC | PRN
  Administered 2014-01-17: 1 via OROMUCOSAL
  Filled 2014-01-16: qty 177

## 2014-01-16 MED ORDER — MIDAZOLAM HCL 5 MG/5ML IJ SOLN
INTRAMUSCULAR | Status: DC | PRN
  Administered 2014-01-16: 2 mg via INTRAVENOUS

## 2014-01-16 MED ORDER — DEXAMETHASONE SODIUM PHOSPHATE 10 MG/ML IJ SOLN
INTRAMUSCULAR | Status: AC
  Filled 2014-01-16: qty 1

## 2014-01-16 MED ORDER — HYDROMORPHONE HCL 1 MG/ML IJ SOLN
0.2500 mg | INTRAMUSCULAR | Status: DC | PRN
  Administered 2014-01-16 (×4): 0.25 mg via INTRAVENOUS
  Administered 2014-01-16: 0.5 mg via INTRAVENOUS

## 2014-01-16 MED ORDER — ESCITALOPRAM OXALATE 10 MG PO TABS
10.0000 mg | ORAL_TABLET | Freq: Every evening | ORAL | Status: DC
  Administered 2014-01-16 – 2014-01-17 (×2): 10 mg via ORAL
  Filled 2014-01-16 (×3): qty 1

## 2014-01-16 MED ORDER — SUCCINYLCHOLINE CHLORIDE 20 MG/ML IJ SOLN
INTRAMUSCULAR | Status: DC | PRN
  Administered 2014-01-16: 100 mg via INTRAVENOUS

## 2014-01-16 MED ORDER — CEFAZOLIN SODIUM-DEXTROSE 2-3 GM-% IV SOLR
2.0000 g | INTRAVENOUS | Status: AC
  Administered 2014-01-16: 2 g via INTRAVENOUS

## 2014-01-16 MED ORDER — FENTANYL CITRATE 0.05 MG/ML IJ SOLN
INTRAMUSCULAR | Status: AC
  Filled 2014-01-16: qty 5

## 2014-01-16 MED ORDER — ACETAMINOPHEN 325 MG PO TABS
650.0000 mg | ORAL_TABLET | Freq: Four times a day (QID) | ORAL | Status: DC | PRN
  Administered 2014-01-17 – 2014-01-18 (×4): 650 mg via ORAL
  Filled 2014-01-16 (×4): qty 2

## 2014-01-16 MED ORDER — SODIUM CHLORIDE 0.9 % IV SOLN: INTRAVENOUS | Status: DC

## 2014-01-16 MED ORDER — BUPIVACAINE LIPOSOME 1.3 % IJ SUSP
20.0000 mL | Freq: Once | INTRAMUSCULAR | Status: DC
  Filled 2014-01-16: qty 20

## 2014-01-16 SURGICAL SUPPLY — 40 items
BAG SPEC THK2 15X12 ZIP CLS (MISCELLANEOUS)
BAG ZIPLOCK 12X15 (MISCELLANEOUS) IMPLANT
BLADE EXTENDED COATED 6.5IN (ELECTRODE) ×2 IMPLANT
BLADE SAG 18X100X1.27 (BLADE) ×2 IMPLANT
CAPT HIP PF COP ×1 IMPLANT
COVER PERINEAL POST (MISCELLANEOUS) ×2 IMPLANT
DECANTER SPIKE VIAL GLASS SM (MISCELLANEOUS) ×2 IMPLANT
DRAPE C-ARM 42X120 X-RAY (DRAPES) ×2 IMPLANT
DRAPE STERI IOBAN 125X83 (DRAPES) ×2 IMPLANT
DRAPE U-SHAPE 47X51 STRL (DRAPES) ×6 IMPLANT
DRSG ADAPTIC 3X8 NADH LF (GAUZE/BANDAGES/DRESSINGS) ×2 IMPLANT
DRSG MEPILEX BORDER 4X4 (GAUZE/BANDAGES/DRESSINGS) ×2 IMPLANT
DRSG MEPILEX BORDER 4X8 (GAUZE/BANDAGES/DRESSINGS) ×2 IMPLANT
DURAPREP 26ML APPLICATOR (WOUND CARE) ×2 IMPLANT
ELECT REM PT RETURN 9FT ADLT (ELECTROSURGICAL) ×2
ELECTRODE REM PT RTRN 9FT ADLT (ELECTROSURGICAL) ×1 IMPLANT
EVACUATOR 1/8 PVC DRAIN (DRAIN) ×2 IMPLANT
FACESHIELD WRAPAROUND (MASK) ×8 IMPLANT
FACESHIELD WRAPAROUND OR TEAM (MASK) ×4 IMPLANT
GAUZE SPONGE 4X4 12PLY STRL (GAUZE/BANDAGES/DRESSINGS) IMPLANT
GLOVE BIO SURGEON STRL SZ7.5 (GLOVE) ×2 IMPLANT
GLOVE BIO SURGEON STRL SZ8 (GLOVE) ×4 IMPLANT
GLOVE BIOGEL PI IND STRL 8 (GLOVE) ×2 IMPLANT
GLOVE BIOGEL PI INDICATOR 8 (GLOVE) ×2
GOWN STRL REUS W/TWL LRG LVL3 (GOWN DISPOSABLE) ×2 IMPLANT
GOWN STRL REUS W/TWL XL LVL3 (GOWN DISPOSABLE) ×2 IMPLANT
KIT BASIN OR (CUSTOM PROCEDURE TRAY) ×2 IMPLANT
NDL SAFETY ECLIPSE 18X1.5 (NEEDLE) ×2 IMPLANT
NEEDLE HYPO 18GX1.5 SHARP (NEEDLE) ×4
PACK TOTAL JOINT (CUSTOM PROCEDURE TRAY) ×2 IMPLANT
STRIP CLOSURE SKIN 1/2X4 (GAUZE/BANDAGES/DRESSINGS) ×2 IMPLANT
SUT ETHIBOND NAB CT1 #1 30IN (SUTURE) ×2 IMPLANT
SUT MNCRL AB 4-0 PS2 18 (SUTURE) ×2 IMPLANT
SUT VIC AB 2-0 CT1 27 (SUTURE) ×6
SUT VIC AB 2-0 CT1 TAPERPNT 27 (SUTURE) ×2 IMPLANT
SUT VLOC 180 0 24IN GS25 (SUTURE) ×2 IMPLANT
SYR 20CC LL (SYRINGE) ×2 IMPLANT
SYR 50ML LL SCALE MARK (SYRINGE) ×2 IMPLANT
TOWEL OR 17X26 10 PK STRL BLUE (TOWEL DISPOSABLE) ×2 IMPLANT
TRAY FOLEY CATH 14FRSI W/METER (CATHETERS) ×2 IMPLANT

## 2014-01-16 NOTE — Interval H&P Note (Signed)
History and Physical Interval Note:  01/16/2014 10:14 AM  Tammy [name]  has presented today for surgery, with the diagnosis of OSTEOARTHRITIS LEFT HIP  The various methods of treatment have been discussed with the patient and family. After consideration of risks, benefits and other options for treatment, the patient has consented to  Procedure(s): LEFT TOTAL HIP ARTHROPLASTY ANTERIOR APPROACH (Left) as a surgical intervention .  The patient's history has been reviewed, patient examined, no change in status, stable for surgery.  I have reviewed the patient's chart and labs.  Questions were answered to the patient's satisfaction.     Loanne Drilling

## 2014-01-16 NOTE — Anesthesia Preprocedure Evaluation (Signed)
Anesthesia Evaluation  Patient identified by MRN, date of birth, ID band Patient awake    Reviewed: Allergy & Precautions, H&P , NPO status , Patient's Chart, lab work & pertinent test results  Airway Mallampati: II TM Distance: >3 FB Neck ROM: Full    Dental no notable dental hx.    Pulmonary neg pulmonary ROS,  breath sounds clear to auscultation  Pulmonary exam normal       Cardiovascular hypertension, Pt. on medications + Valvular Problems/Murmurs Rhythm:Regular Rate:Normal     Neuro/Psych PSYCHIATRIC DISORDERS Depression negative neurological ROS     GI/Hepatic negative GI ROS, Neg liver ROS,   Endo/Other  negative endocrine ROS  Renal/GU negative Renal ROS  negative genitourinary   Musculoskeletal  (+) Arthritis -,   Abdominal   Peds negative pediatric ROS (+)  Hematology negative hematology ROS (+)   Anesthesia Other Findings   Reproductive/Obstetrics negative OB ROS                           Anesthesia Physical Anesthesia Plan  ASA: II  Anesthesia Plan: General   Post-op Pain Management:    Induction: Intravenous  Airway Management Planned: Oral ETT  Additional Equipment:   Intra-op Plan:   Post-operative Plan: Extubation in OR  Informed Consent: I have reviewed the patients History and Physical, chart, labs and discussed the procedure including the risks, benefits and alternatives for the proposed anesthesia with the patient or authorized representative who has indicated his/her understanding and acceptance.   Dental advisory given  Plan Discussed with: CRNA  Anesthesia Plan Comments: (Discussed general and spinal. PTT 40. No known bleeding disorder. Plan general.)        Anesthesia Quick Evaluation

## 2014-01-16 NOTE — Evaluation (Signed)
Physical Therapy Evaluation Patient Details Name: Tammy [name] MRN: [id number] DOB: 16-Jul-1950 Today's Date: 01/16/2014   History of Present Illness  [name]  Clinical Impression  Patient ambulated x 10', nausea and pain limiting. Patient  Wants to consider Post acute rehab as she will be home alone and has  About 24 steps to apartment. Patient will benefit from PT to address problems listed..  Follow Up Recommendations SNF;Supervision/Assistance - 24 hour    Equipment Recommendations       Recommendations for Other Services       Precautions / Restrictions Precautions Precautions: Fall      Mobility  Bed Mobility Overal bed mobility: Needs Assistance Bed Mobility: Supine to Sit     Supine to sit: Mod assist     General bed mobility comments: cues for technique.  Transfers Overall transfer level: Needs assistance Equipment used: Rolling walker (2 wheeled) Transfers: Sit to/from Stand Sit to Stand: Min assist;From elevated surface;+2 safety/equipment         General transfer comment: cues for hand placement  Ambulation/Gait Ambulation/Gait assistance: Min assist;+2 safety/equipment Ambulation Distance (Feet): 10 Feet Assistive device: Rolling walker (2 wheeled) Gait Pattern/deviations: Step-to pattern;Decreased step length - left;Antalgic     General Gait Details: cues for sequence  Stairs            Wheelchair Mobility    Modified Rankin (Stroke Patients Only)       Balance                                             Pertinent Vitals/Pain Pain Assessment: 0-10 Pain Score: 4  Pain Location: L thigh/hip Pain Descriptors / Indicators: Aching;Sharp Pain Intervention(s): Monitored during session;Patient requesting pain meds-RN notified;Ice applied    Home Living Family/patient expects to be discharged to:: Private residence Living Arrangements: Children;Other relatives Available Help at Discharge: Available  PRN/intermittently Type of Home: Apartment Home Access: Stairs to enter Entrance Stairs-Rails: Right;Left Entrance Stairs-Number of Steps: 24- with 2 landings.   Home Equipment: Gilmer Mor - single point      Prior Function Level of Independence: Independent with assistive device(s)               Hand Dominance        Extremity/Trunk Assessment   Upper Extremity Assessment: Overall WFL for tasks assessed           Lower Extremity Assessment: LLE deficits/detail   LLE Deficits / Details: difficulty advancing  L leg.     Communication   Communication: No difficulties  Cognition Arousal/Alertness: Awake/alert Behavior During Therapy: WFL for tasks assessed/performed Overall Cognitive Status: Within Functional Limits for tasks assessed                      General Comments      Exercises        Assessment/Plan    PT Assessment Patient needs continued PT services  PT Diagnosis Difficulty walking;Acute pain   PT Problem List Decreased strength;Decreased range of motion;Decreased activity tolerance;Decreased mobility;Decreased knowledge of precautions;Decreased safety awareness;Decreased knowledge of use of DME;Pain  PT Treatment Interventions DME instruction;Gait training;Stair training;Functional mobility training;Therapeutic activities;Therapeutic exercise;Patient/family education   PT Goals (Current goals can be found in the Care Plan section) Acute Rehab PT Goals Patient Stated Goal: to go home, but maybe I need  rehab PT Goal Formulation:  With patient Time For Goal Achievement: 01/23/14 Potential to Achieve Goals: Good    Frequency 7X/week   Barriers to discharge Decreased caregiver support      Co-evaluation               End of Session   Activity Tolerance: Patient limited by fatigue Patient left: in chair;with call bell/phone within reach Nurse Communication: Mobility status         Time: 0981-1914 PT Time Calculation (min):  17 min   Charges:   PT Evaluation $Initial PT Evaluation Tier I: 1 Procedure PT Treatments $Gait Training: 8-22 mins   PT G Codes:          Rada Hay 01/16/2014, 5:11 PM

## 2014-01-16 NOTE — Anesthesia Postprocedure Evaluation (Signed)
  Anesthesia Post-op Note  Patient: Tammy [name]  Procedure(s) Performed: Procedure(s) (LRB): LEFT TOTAL HIP ARTHROPLASTY ANTERIOR APPROACH (Left)  Patient Location: PACU  Anesthesia Type: General  Level of Consciousness: awake and alert   Airway and Oxygen Therapy: Patient Spontanous Breathing  Post-op Pain: mild  Post-op Assessment: Post-op Vital signs reviewed, Patient's Cardiovascular Status Stable, Respiratory Function Stable, Patent Airway and No signs of Nausea or vomiting  Last Vitals:  Filed Vitals:   01/16/14 1246  BP: 115/88  Pulse: 67  Temp: 36.9 C  Resp: 23    Post-op Vital Signs: stable   Complications: No apparent anesthesia complications

## 2014-01-16 NOTE — Transfer of Care (Signed)
Immediate Anesthesia Transfer of Care Note  Patient: Tammy [name]  Procedure(s) Performed: Procedure(s): LEFT TOTAL HIP ARTHROPLASTY ANTERIOR APPROACH (Left)  Patient Location: PACU  Anesthesia Type:General  Level of Consciousness: awake, alert  and oriented  Airway & Oxygen Therapy: Patient Spontanous Breathing and Patient connected to face mask oxygen  Post-op Assessment: Report given to PACU RN and Post -op Vital signs reviewed and stable  Post vital signs: Reviewed and stable  Complications: No apparent anesthesia complications

## 2014-01-16 NOTE — Progress Notes (Signed)
Patient has been taking Potassium for the last 5 days

## 2014-01-16 NOTE — Op Note (Signed)
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   POSTOPERATIVE DIAGNOSIS: Osteoarthritis of the Left  Hip with protrusio deformity.   PROCEDURE: Left total hip arthroplasty, anterior approach with acetabular autografting.   SURGEON: Ollen Gross, MD   ASSISTANT: Avel Peace, PA-C  ANESTHESIA:  General  ESTIMATED BLOOD LOSS:-200 ml    DRAINS: Hemovac x1.   COMPLICATIONS: None   CONDITION: PACU - hemodynamically stable.   BRIEF CLINICAL NOTE: Tammy [name] is a 63 y.o. female who has advanced end-  stage arthritis of her Left  hip with progressively worsening pain and  dysfunction.The patient has failed nonoperative management and presents for  total hip arthroplasty.   PROCEDURE IN DETAIL: After successful administration of spinal  anesthetic, the traction boots for the [facility] bed were placed on both  feet and the patient was placed onto the [facility] bed, boots placed into the leg  holders. The Left hip was then isolated from the perineum with plastic  drapes and prepped and draped in the usual sterile fashion. ASIS and  greater trochanter were marked and a oblique incision was made, starting  at about 1 cm lateral and 2 cm distal to the ASIS and coursing towards  the anterior cortex of the femur. The skin was cut with a 10 blade  through subcutaneous tissue to the level of the fascia overlying the  tensor fascia lata muscle. The fascia was then incised in line with the  incision at the junction of the anterior third and posterior 2/3rd. The  muscle was teased off the fascia and then the interval between the TFL  and the rectus was developed. The Hohmann retractor was then placed at  the top of the femoral neck over the capsule. The vessels overlying the  capsule were cauterized and the fat on top of the capsule was removed.  A Hohmann retractor was then placed anterior underneath the rectus  femoris to give  exposure to the entire anterior capsule. A T-shaped  capsulotomy was performed. The edges were tagged and the femoral head  was identified.       Osteophytes are removed off the superior acetabulum.  The femoral neck was then cut in situ with an oscillating saw. Traction  was then applied to the left lower extremity utilizing the [facility]  traction. The femoral head was then removed. Retractors were placed  around the acetabulum and then circumferential removal of the labrum was  performed. Osteophytes were also removed. Reaming starts at 45 mm to  medialize and  Increased in 2 mm increments to 51 mm. We reamed in  approximately 40 degrees of abduction, 20 degrees anteversion. I then placed autograft from the femoral head into the acetabular defect to restore the native center of the acetabulum. A 52 mm  pinnacle acetabular shell was then impacted in anatomic position under  fluoroscopic guidance with excellent purchase. We did not need to place  any additional dome screws. A 32 mm neutral + 4 marathon liner was then  placed into the acetabular shell.       The femoral lift was then placed along the lateral aspect of the femur  just distal to the vastus ridge. The leg was  externally rotated and capsule  was stripped off the inferior aspect of the femoral neck down to the  level of the lesser trochanter, this was done with electrocautery. The femur was lifted after this was  performed. The  leg was then placed and extended in adducted position to essentially delivering the femur. We also removed the capsule superiorly and the  piriformis from the piriformis fossa to gain excellent exposure of the  proximal femur. Rongeur was used to remove some cancellous bone to get  into the lateral portion of the proximal femur for placement of the  initial starter reamer. The starter broaches was placed  the starter broach  and was shown to go down the center of the canal. Broaching  with the  Corail system  was then performed starting at size 8, coursing  Up to size 10. A size 10 had excellent torsional and rotational  and axial stability. The trial standard offset neck was then placed  with a 32 + 1 trial head. The hip was then reduced. We confirmed that  the stem was in the canal both on AP and lateral x-rays. It also has excellent sizing. The hip was reduced with outstanding stability through full extension, full external rotation,  and then flexion in adduction internal rotation. AP pelvis was taken  and the leg lengths were measured and found to be exactly equal. Hip  was then dislocated again and the femoral head and neck removed. The  femoral broach was removed. Size 10 Corail stem with a standard offset  neck was then impacted into the femur following native anteversion. Has  excellent purchase in the canal. Excellent torsional and rotational and  axial stability. It is confirmed to be in the canal on AP and lateral  fluoroscopic views. The 32 + 1 ceramic head was placed and the hip  reduced with outstanding stability. Again AP pelvis was taken and it  confirmed that the leg lengths were equal. The wound was then copiously  irrigated with saline solution and the capsule reattached and repaired  with Ethibond suture.  20 mL of Exparel mixed with 50 mL of saline then additional 20 ml of .25% Bupivicaine injected into the capsule and into the edge of the tensor fascia lata as well as subcutaneous tissue. The fascia overlying the tensor fascia lata was  then closed with a running #1 V-Loc. Subcu was closed with interrupted  2-0 Vicryl and subcuticular running 4-0 Monocryl. Incision was cleaned  and dried. Steri-Strips and a bulky sterile dressing applied. Hemovac  drain was hooked to suction and then he was awakened and transported to  recovery in stable condition.        Please note that a surgical assistant was a medical necessity for this procedure to perform it in a safe and expeditious  manner. Assistant was necessary to provide appropriate retraction of vital neurovascular structures and to prevent femoral fracture and allow for anatomic placement of the prosthesis.  Ollen Gross, M.D.

## 2014-01-16 NOTE — Plan of Care (Signed)
Problem: Consults Goal: Diagnosis- Total Joint Replacement Left anterior hip     

## 2014-01-16 NOTE — H&P (View-Only) (Signed)
Tammy [name] DOB: [birth year] Divorced / Language: [place] / Race: Black or African American, White Female Date of Admission:  01/16/2014 Chief Complaint:  Left Hip pain History of Present Illness The patient is a 63 year old female who comes in  for a preoperative history and physical. The patient is scheduled for a left total hip arthroplasty (anterior approach) to be performed by Dr. Gus Rankin. Aluisio, MD at [facility] on 01/16/2014. The patient is a 63 year old female who presents for follow up of their hip. The patient is being followed for their left hip pain and osteoarthritis. They are several months out from intra-articular injection. Symptoms reported today include: pain, aching and difficulty ambulating. and report their pain level to be moderate. Current treatment includes: NSAIDs (Mobic). The patient has reported improvement of their symptoms with: Cortisone injections. Unfortunately her hip is getting progressively worse. She has pain at all times. It limits what she can and cannot do. She had been compensating well until recently. She is at a stage now where she is ready to go ahead and get the left hip fixed. The injection did relieve some of the intense pain, but the pain is still present at all times. She is ready to proceed with hip surgery. They have been treated conservatively in the past for the above stated problem and despite conservative measures, they continue to have progressive pain and severe functional limitations and dysfunction. They have failed non-operative management including home exercise, medications, and injections. It is felt that they would benefit from undergoing total joint replacement. Risks and benefits of the procedure have been discussed with the patient and they elect to proceed with surgery. There are no active contraindications to surgery such as ongoing infection or rapidly progressive neurological disease.  Problem List/Past Medical  Hip  osteoarthritis (715.95  M16.9) High blood pressure Hypercholesterolemia Anxiety Disorder Depression Heart murmur  Allergies  No Known Drug Allergies  Family History Cancer Maternal Grandfather, Mother. Cerebrovascular Accident Maternal Grandmother. Diabetes Mellitus Sister. Hypertension Maternal Grandmother, Sister.  Social History Living situation live alone Marital status divorced Children 2 Exercise Exercises never Current work status working full time No history of drug/alcohol rehab Tobacco use Never smoker. 05/17/2013 Number of flights of stairs before winded 2-3 Never consumed alcohol 05/17/2013: Never consumed alcohol Not under pain contract Post-Surgical Plans Home versus SNF depending upon her progress.  Medication History TraMADol HCl (  Tablet, 1-2 Tablet Oral every 6-8 hours as needed for pain, Taken starting 12/25/2013) Active. Naproxen Sodium (  Tablet, Oral) Active. (bid) Metoprolol Succinate ER (  Tablet ER 24HR, Oral) Active. Felodipine ER (  Tablet ER 24HR, Oral) Active. Atorvastatin Calcium (  Tablet, Oral) Active. Lexapro (  Tablet, Oral) Active. Lisinopril-Hydrochlorothiazide (20-12.5MG  Tablet, Oral) Active. (BID) Aspirin EC (  Tablet DR, Oral) Active.   Past Surgical History No pertinent past surgical history   Review of Systems General Not Present- Chills, Fatigue, Fever, Memory Loss, Night Sweats, Weight Gain and Weight Loss. Skin Not Present- Eczema, Hives, Itching, Lesions and Rash. HEENT Not Present- Dentures, Double Vision, Headache, Hearing Loss, Tinnitus and Visual Loss. Respiratory Not Present- Allergies, Chronic Cough, Coughing up blood, Shortness of breath at rest and Shortness of breath with exertion. Cardiovascular Not Present- Chest Pain, Difficulty Breathing Lying Down, Murmur, Palpitations, Racing/skipping heartbeats and Swelling. Gastrointestinal Not Present- Abdominal Pain,  Bloody Stool, Constipation, Diarrhea, Difficulty Swallowing, Heartburn, Jaundice, Loss of appetitie, Nausea and Vomiting. Female Genitourinary Not Present- Blood in Urine, Discharge, Flank Pain, Incontinence, Painful Urination, Urgency, Urinary  frequency, Urinary Retention, Urinating at Night and Weak urinary stream. Musculoskeletal Present- Back Pain, Morning Stiffness, Muscle Pain and Spasms. Not Present- Joint Pain, Joint Swelling and Muscle Weakness. Neurological Not Present- Blackout spells, Difficulty with balance, Dizziness, Paralysis, Tremor and Weakness. Psychiatric Not Present- Insomnia.   Vitals Pulse: 84 (Regular)  Resp.: 16 (Unlabored)  BP: 152/92 (Sitting, Right Arm, Standard)   Physical Exam General Mental Status -Alert, cooperative and good historian. General Appearance-pleasant, Not in acute distress. Orientation-Oriented X3. Build & Nutrition-Well nourished and Well developed.  Head and Neck Head-normocephalic, atraumatic . Neck Global Assessment - supple, no bruit auscultated on the right, no bruit auscultated on the left.  Eye Vision-Wears corrective lenses. Pupil - Bilateral-Regular and Round. Motion - Bilateral-EOMI.  Chest and Lung Exam Auscultation Breath sounds - clear at anterior chest wall and clear at posterior chest wall. Adventitious sounds - No Adventitious sounds.  Cardiovascular Auscultation Rhythm - Regular rate and rhythm. Heart Sounds - S1 WNL and S2 WNL. Murmurs & Other Heart Sounds - Auscultation of the heart reveals - No Murmurs.  Abdomen Palpation/Percussion Tenderness - Abdomen is non-tender to palpation. Rigidity (guarding) - Abdomen is soft. Auscultation Auscultation of the abdomen reveals - Bowel sounds normal.  Female Genitourinary Note: Not done, not pertinent to present illness   Musculoskeletal Note: She is alert and oriented in no apparent distress. The left hip can be flexed to 85 with no internal  or external rotation, only about 5 degrees of abduction. The right hip flexes to about 110 with about 5 degrees of internal and external rotation, about 20 degrees of abduction.  RADIOGRAPHS: AP pelvis and lateral of the hip. 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  Assessment & Plan Osteoarthritis of left hip (715.95  M16.12) Impression: Left Hip Note:Plan is for a Left Total Hip Replacement - Anterior Approach by Dr. Lequita Halt.  Plan is to go home versus SNF.  PCP - Dr. Darral Dash  The patient does not have any contraindications and will receive TXA (tranexamic acid) prior to surgery.  Signed electronically by Lauraine Rinne, III PA-C

## 2014-01-17 ENCOUNTER — Encounter (HOSPITAL_COMMUNITY): Payer: Self-pay | Admitting: Student

## 2014-01-17 LAB — CBC
HEMATOCRIT: 34.1 % — AB (ref 36.0–46.0)
Hemoglobin: 10.9 g/dL — ABNORMAL LOW (ref 12.0–15.0)
MCH: 28.5 pg (ref 26.0–34.0)
MCHC: 32 g/dL (ref 30.0–36.0)
MCV: 89.3 fL (ref 78.0–100.0)
Platelets: 296 10*3/uL (ref 150–400)
RBC: 3.82 MIL/uL — ABNORMAL LOW (ref 3.87–5.11)
RDW: 14.3 % (ref 11.5–15.5)
WBC: 15.2 10*3/uL — ABNORMAL HIGH (ref 4.0–10.5)

## 2014-01-17 LAB — BASIC METABOLIC PANEL
Anion gap: 10 (ref 5–15)
BUN: 7 mg/dL (ref 6–23)
CO2: 25 mEq/L (ref 19–32)
CREATININE: 0.57 mg/dL (ref 0.50–1.10)
Calcium: 9.1 mg/dL (ref 8.4–10.5)
Chloride: 106 mEq/L (ref 96–112)
GFR calc Af Amer: >90 mL/min (ref >90)
Glucose, Bld: 132 mg/dL — ABNORMAL HIGH (ref 70–99)
POTASSIUM: 4.4 meq/L (ref 3.7–5.3)
Sodium: 141 mEq/L (ref 137–147)

## 2014-01-17 NOTE — Progress Notes (Addendum)
Physical Therapy Treatment Patient Details Name: Tammy [name] MRN: [id number] DOB: 1951/04/05 Today's Date: 01/17/2014    History of Present Illness [name]    PT Comments    POD # 1 am session.  Assisted pt out of recliner to amb limited distance in hallway then to BR.  Assisted in bathroom then to recliner to perform THR TE's followed by ICE.  Pt required increased time and increased cueing on proper gait sequencing.    Follow Up Recommendations  [facility]     Equipment Recommendations       Recommendations for Other Services       Precautions / Restrictions Precautions Precautions: Fall Restrictions Weight Bearing Restrictions: No    Mobility  Bed Mobility               General bed mobility comments: Pt OOB in recliner  Transfers Overall transfer level: Needs assistance Equipment used: Rolling walker (2 wheeled) Transfers: Sit to/from Stand Sit to Stand: Min assist         General transfer comment: cues for hand placement plus increased time  Ambulation/Gait Ambulation/Gait assistance: Min assist Ambulation Distance (Feet): 15 Feet Assistive device: Rolling walker (2 wheeled) Gait Pattern/deviations: Step-to pattern Gait velocity: decreased   General Gait Details: 75% VC's on proper sequencing and increased time   Stairs            Wheelchair Mobility    Modified Rankin (Stroke Patients Only)       Balance                                    Cognition Arousal/Alertness: Awake/alert Behavior During Therapy: WFL for tasks assessed/performed Overall Cognitive Status: Within Functional Limits for tasks assessed                      Exercises   Total Hip Replacement TE's 10 reps ankle pumps 10 reps knee presses 10 reps heel slides 10 reps SAQ's 10 reps ABD Followed by ICE    General Comments        Pertinent Vitals/Pain Pain Assessment: 0-10 Pain Score: 5  Pain Location: L hip Pain Descriptors /  Indicators: Aching Pain Intervention(s): Repositioned;Ice applied    Home Living Family/patient expects to be discharged to:: Private residence Living Arrangements: Children;Other relatives Available Help at Discharge: Available PRN/intermittently Type of Home: Apartment Home Access: Stairs to enter Entrance Stairs-Rails: Right;Left   Home Equipment: Cane - single point      Prior Function Level of Independence: Independent with assistive device(s)          PT Goals (current goals can now be found in the care plan section) Acute Rehab PT Goals Patient Stated Goal: to go home, but maybe I need  rehab Progress towards PT goals: Progressing toward goals    Frequency  7X/week    PT Plan      Co-evaluation             End of Session Equipment Utilized During Treatment: Gait belt Activity Tolerance: Patient limited by fatigue;Patient limited by pain Patient left: in chair;with call bell/phone within reach     Time: 4098-1191 PT Time Calculation (min): 24 min  Charges:  $Gait Training: 8-22 mins $Therapeutic Activity: 8-22 mins                    G Codes:  Rica Koyanagi  PTA [facility]  Acute  Rehab Pager      [phone]

## 2014-01-17 NOTE — Care Management Note (Signed)
    Page 1 of 2   01/17/2014     11:33:55 AM CARE MANAGEMENT NOTE 01/17/2014  Patient:  NHUNG, DANKO   Account Number:  [id number]  Date Initiated:  01/17/2014  Documentation initiated by:  [facility]  Subjective/Objective Assessment:   adm: LEFT TOTAL HIP ARTHROPLASTY ANTERIOR APPROACH (Left)     Action/Plan:   discharge planning   Anticipated DC Date:  01/18/2014   Anticipated DC Plan:  [place]  CM consult      [facility] Choice  HOME HEALTH   Choice offered to / List presented to:  C-1 Patient   DME arranged  3-N-1  Vassie Moselle      DME agency  [place] arranged  [place]   Status of service:  Completed, signed off Medicare Important Message given?   (If response is "NO", the following Medicare IM given date fields will be blank) Date Medicare IM given:   Medicare IM given by:   Date Additional Medicare IM given:   Additional Medicare IM given by:    Discharge Disposition:  [place]  Per UR Regulation:    If discussed at Long Length of Stay Meetings, dates discussed:    Comments:  01/17/14 10:30 CM met with pt in room to offer choice of home health agency.  Pt chooses [facility] for HHPT.  Address and contact information verified with pt.  Pt states she lives on the second floor of an apt complex and is NOT opposed to ambulance transport home; CM notified CSW to arrange at time of discharge.  Referral made to [facility] (on unit).  CM requested DME3n1 and rolling walker to be delivered today so daughter can take home as pt will be going home in ambulance.  No other CM needs were communicated.  Mariane Masters, BSN, [place].

## 2014-01-17 NOTE — Plan of Care (Signed)
Problem: Consults Goal: Diagnosis- Total Joint Replacement Outcome: Completed/Met Date Met:  01/17/14 Left anterior hip

## 2014-01-17 NOTE — Progress Notes (Signed)
Came to visit patient at bedside on behalf of [facility] to [facility] Care Management program for [facility] employees/dependents with [facility] insurance. She reports she plans on going home at discharge with home health services. Discussed Link to Wellness program and packet given to her for completion for HTN. Consents obtained. Will continue to follow.  Raiford Noble, MSN- RN,BSN- [facility] [phone]

## 2014-01-17 NOTE — Progress Notes (Signed)
Clinical Social Work  CSW met with patient at bedside. Patient reports she has worked with therapy and wants to go home with [facility] at DC. CSW will continue to follow and will assist with PTAR transportation home when DC.  Sindy Messing, LCSW (Coverage for [employer])

## 2014-01-17 NOTE — Progress Notes (Signed)
Clinical Social Work Department BRIEF PSYCHOSOCIAL ASSESSMENT 01/17/2014  Patient:  Tammy [name], Tammy [name]     Account Number:  [id number]     Admit date:  01/16/2014  Clinical Social Worker:  Earlie Server  Date/Time:  01/17/2014 10:00 AM  Referred by:  Physician  Date Referred:  01/17/2014 Referred for  SNF Placement   Other Referral:   Interview type:  Patient Other interview type:    PSYCHOSOCIAL DATA Living Status:  FAMILY Admitted from facility:   Level of care:   Primary support name:  [place] Primary support relationship to patient:  CHILD, ADULT Degree of support available:   Strong    CURRENT CONCERNS Current Concerns  Post-Acute Placement   Other Concerns:    SOCIAL WORK ASSESSMENT / PLAN CSW received referral to assist with DC planning. CSW reviewed chart and met with patient at bedside. CSW introduced myself and explained role.    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 Patient lives at home with her dtr and her 62 year old grandson. Patient's dtr works at a [employer] so she is at work from [employer] until late at night. Patient reports that she cares for her grandson in the evenings. Patient reports a long history of knee problems and states that she has learned how to manage pain and to navigate in her apartment. CSW spoke with patient about SNF at DC. Patient reports she had considered SNF but then there would not be anyone at home to watch grandson. Patient states that dtr does not get time off of work and that she would lose her job. Patient states that she feels she needs to return home and support her family.    CSW suggested that patient work with PT/OT today and that CSW will follow up to get patient's final decision re: [facility] vs SNF.   Assessment/plan status:  Psychosocial Support/Ongoing Assessment of Needs Other assessment/ plan:   Information/referral to community resources:   SNF information    PATIENT'S/FAMILY'S  RESPONSE TO PLAN OF CARE: Patient alert and oriented. Patient engaged during assessment but torn about DC plans. Patient wants to do what is best for her recovery but also relies heavily on family support. Patient is aware of safety concerns but feels she could manage at home with support of family. Patient agreeable for CSW to continue to follow.       Sindy Messing, LCSW (Coverage for [employer])

## 2014-01-17 NOTE — Discharge Instructions (Addendum)
Dr. Gaynelle Arabian Total Joint Specialist [facility] [address] [place], [place] [address] [phone]    ANTERIOR APPROACH TOTAL HIP REPLACEMENT POSTOPERATIVE DIRECTIONS   Hip Rehabilitation, Guidelines Following Surgery  The results of a hip operation are greatly improved after range of motion and muscle strengthening exercises. Follow all safety measures which are given to protect your hip. If any of these exercises cause increased pain or swelling in your joint, decrease the amount until you are comfortable again. Then slowly increase the exercises. Call your caregiver if you have problems or questions.  HOME CARE INSTRUCTIONS  Most of the following instructions are designed to prevent the dislocation of your new hip.  Remove items at home which could result in a fall. This includes throw rugs or furniture in walking pathways.  Continue medications as instructed at time of discharge.  You may have some home medications which will be placed on hold until you complete the course of blood thinner medication.  You may start showering once you are discharged home but do not submerge the incision under water. Just pat the incision dry and apply a dry gauze dressing on daily. Do not put on socks or shoes without following the instructions of your caregivers.  Sit on high chairs which makes it easier to stand.  Sit on chairs with arms. Use the chair arms to help push yourself up when arising.  Keep your leg on the side of the operation out in front of you when standing up.  Arrange for the use of a toilet seat elevator so you are not sitting low.    Walk with walker as instructed.  You may resume a sexual relationship in one month or when given the OK by your caregiver.  Use walker as long as suggested by your caregivers.  You may put full weight on your legs and walk as much as is comfortable. Avoid periods of inactivity such as sitting longer than an hour  when not asleep. This helps prevent blood clots.  You may return to work once you are cleared by Engineer, production.  Do not drive a car for 6 weeks or until released by your surgeon.  Do not drive while taking narcotics.  Wear elastic stockings for three weeks following surgery during the day but you may remove then at night.  Make sure you keep all of your appointments after your operation with all of your doctors and caregivers. You should call the office at the above phone number and make an appointment for approximately two weeks after the date of your surgery. Change the dressing daily and reapply a dry dressing each time. Please pick up a stool softener and laxative for home use as long as you are requiring pain medications.  Continue to use ice on the hip for pain and swelling from surgery. You may notice swelling that will progress down to the foot and ankle.  This is normal after surgery.  Elevate the leg when you are not up walking on it.   It is important for you to complete the blood thinner medication as prescribed by your doctor.  Continue to use the breathing machine which will help keep your temperature down.  It is common for your temperature to cycle up and down following surgery, especially at night when you are not up moving around and exerting yourself.  The breathing machine keeps your lungs expanded and your temperature down.  RANGE OF MOTION AND STRENGTHENING EXERCISES  These exercises are designed to help you keep full movement of your hip joint. Follow your caregiver's or physical therapist's instructions. Perform all exercises about fifteen times, three times per day or as directed. Exercise both hips, even if you have had only one joint replacement. These exercises can be done on a training (exercise) mat, on the floor, on a table or on a bed. Use whatever works the best and is most comfortable for you. Use music or television while you are exercising so that the exercises are a  pleasant break in your day. This will make your life better with the exercises acting as a break in routine you can look forward to.  Lying on your back, slowly slide your foot toward your buttocks, raising your knee up off the floor. Then slowly slide your foot back down until your leg is straight again.  Lying on your back spread your legs as far apart as you can without causing discomfort.  Lying on your side, raise your upper leg and foot straight up from the floor as far as is comfortable. Slowly lower the leg and repeat.  Lying on your back, tighten up the muscle in the front of your thigh (quadriceps muscles). You can do this by keeping your leg straight and trying to raise your heel off the floor. This helps strengthen the largest muscle supporting your knee.  Lying on your back, tighten up the muscles of your buttocks both with the legs straight and with the knee bent at a comfortable angle while keeping your heel on the floor.   SKILLED REHAB INSTRUCTIONS: If the patient is transferred to a skilled rehab facility following release from the hospital, a list of the current medications will be sent to the facility for the patient to continue.  When discharged from the skilled rehab facility, please have the facility set up the patient's Home Health Physical Therapy prior to being released. Also, the skilled facility will be responsible for providing the patient with their medications at time of release from the facility to include their pain medication, the muscle relaxants, and their blood thinner medication. If the patient is still at the rehab facility at time of the two week follow up appointment, the skilled rehab facility will also need to assist the patient in arranging follow up appointment in our office and any transportation needs.  MAKE SURE YOU:  Understand these instructions.  Will watch your condition.  Will get help right away if you are not doing well or get worse.  Pick up  stool softner and laxative for home. Do not submerge incision under water. May shower. Continue to use ice for pain and swelling from surgery. Total Hip Protocol.  Take Xarelto for two and a half more weeks, then discontinue Xarelto. Once the patient has completed the Xarelto, they may resume the 81 mg Aspirin.  Information on my medicine - XARELTO (Rivaroxaban)  This medication education was reviewed with me or my healthcare representative as part of my discharge preparation.  The pharmacist that spoke with me during my hospital stay was:  Milus Glazier, [facility]  Why was Xarelto prescribed for you? Xarelto was prescribed for you to reduce the risk of blood clots forming after orthopedic surgery. The medical term for these abnormal blood clots is venous thromboembolism (VTE).  What do you need to know about xarelto ? Take your Xarelto ONCE DAILY at the same time every day. You may take it either with or without food.  If you have difficulty swallowing the tablet whole, you may crush it and mix in applesauce just prior to taking your dose.  Take Xarelto exactly as prescribed by your doctor and DO NOT stop taking Xarelto without talking to the doctor who prescribed the medication.  Stopping without other VTE prevention medication to take the place of Xarelto may increase your risk of developing a clot.  After discharge, you should have regular check-up appointments with your healthcare provider that is prescribing your Xarelto.    What do you do if you miss a dose? If you miss a dose, take it as soon as you remember on the same day then continue your regularly scheduled once daily regimen the next day. Do not take two doses of Xarelto on the same day.   Important Safety Information A possible side effect of Xarelto is bleeding. You should call your healthcare provider right away if you experience any of the following:   Bleeding from an injury or your nose that does not stop.    Unusual colored urine (red or dark brown) or unusual colored stools (red or black).   Unusual bruising for unknown reasons.   A serious fall or if you hit your head (even if there is no bleeding).  Some medicines may interact with Xarelto and might increase your risk of bleeding while on Xarelto. To help avoid this, consult your healthcare provider or pharmacist prior to using any new prescription or non-prescription medications, including herbals, vitamins, non-steroidal anti-inflammatory drugs (NSAIDs) and supplements.  This website has more information on Xarelto: VisitDestination.com.br.

## 2014-01-17 NOTE — Evaluation (Signed)
Occupational Therapy Evaluation Patient Details Name: Tammy [name] MRN: [id number] DOB: 1950-09-18 Today's Date: 01/17/2014    History of Present Illness Tammy [name] THA   Clinical Impression   Pt up to practice 3in1 transfer with OT using walker. She will benefit from additional OT to increase independence for d/c home.     Follow Up Recommendations  Home health OT    Equipment Recommendations  3 in 1 bedside comode    Recommendations for Other Services       Precautions / Restrictions Precautions Precautions: Fall Restrictions Weight Bearing Restrictions: No      Mobility Bed Mobility                  Transfers Overall transfer level: Needs assistance Equipment used: Rolling walker (2 wheeled) Transfers: Sit to/from Stand Sit to Stand: Min assist         General transfer comment: cues for hand placement    Balance                                            ADL Overall ADL's : Needs assistance/impaired Eating/Feeding: Independent;Sitting   Grooming: Wash/dry hands;Set up;Sitting   Upper Body Bathing: Set up;Sitting   Lower Body Bathing: Moderate assistance;Sit to/from stand   Upper Body Dressing : Set up;Sitting   Lower Body Dressing: Moderate assistance;Sit to/from stand   Toilet Transfer: Minimal assistance;Ambulation;BSC;RW   Toileting- Clothing Manipulation and Hygiene: Minimal assistance;Sit to/from stand         General ADL Comments: Pt states she will likely sponge bathe initially as she has a tub to step over but did discuss options of where to obtain a tubseat if she decides she needs one. Also discussed AE but pt is concerned with cost so suggested that if pt's daughter is home during the day, she may be able to assist pt with starting clothing, etc over foot until pt is able. Pt states daughter can help until she can reach to her feet. Pt tends to sit quickly especially with sitting on the toilet due to need to  urinate. Discussed with pt making sure she allows time to get to the bathroom and using a pad if needed intially. She needs some cues for walker distance to self.      Vision                     Perception     Praxis      Pertinent Vitals/Pain Pain Assessment: 0-10 Pain Score: 5  Pain Location: Tammy hip Pain Descriptors / Indicators: Aching Pain Intervention(s): Repositioned;Ice applied     Hand Dominance     Extremity/Trunk Assessment Upper Extremity Assessment Upper Extremity Assessment: Overall WFL for tasks assessed           Communication Communication Communication: No difficulties   Cognition Arousal/Alertness: Awake/alert Behavior During Therapy: WFL for tasks assessed/performed Overall Cognitive Status: Within Functional Limits for tasks assessed                     General Comments       Exercises       Shoulder Instructions      Home Living Family/patient expects to be discharged to:: Private residence Living Arrangements: Children;Other relatives Available Help at Discharge: Available PRN/intermittently Type of Home: Apartment Home Access: Stairs to enter Entrance  Stairs-Number of Steps: 24- with 2 landings. Entrance Stairs-Rails: Right;Left       Bathroom Shower/Tub: Chief Strategy Officer: Standard     Home Equipment: Cane - single point          Prior Functioning/Environment Level of Independence: Independent with assistive device(s)             OT Diagnosis: Generalized weakness   OT Problem List: Decreased strength;Decreased knowledge of use of DME or AE   OT Treatment/Interventions: Self-care/ADL training;Patient/family education;Therapeutic activities;DME and/or AE instruction    OT Goals(Current goals can be found in the care plan section) Acute Rehab OT Goals Patient Stated Goal: to go home, but maybe I need  rehab OT Goal Formulation: With patient Time For Goal Achievement:  01/24/14 Potential to Achieve Goals: Good  OT Frequency: Min 2X/week   Barriers to D/C:            Co-evaluation              End of Session Equipment Utilized During Treatment: Gait belt;Rolling walker  Activity Tolerance: Patient tolerated treatment well Patient left: in chair;with call bell/phone within reach   Time: 1130-1200 OT Time Calculation (min): 30 min Charges:  OT General Charges $OT Visit: 1 Procedure OT Evaluation $Initial OT Evaluation Tier I: 1 Procedure OT Treatments $Self Care/Home Management : 8-22 mins $Therapeutic Activity: 8-22 mins G-Codes:    Tammy [name] [phone] 01/17/2014, 12:25 PM

## 2014-01-17 NOTE — Progress Notes (Signed)
   Subjective: 1 Day Post-Op Procedure(s) (LRB): LEFT TOTAL HIP ARTHROPLASTY ANTERIOR APPROACH (Left) Patient reports pain as mild.   Patient seen in rounds with Dr. Lequita Halt. Patient is well, but has had some minor complaints of pain in the hip, requiring pain medications We will start therapy today.  Plan is to go Home after hospital stay.  Objective: Vital signs in last 24 hours: Temp:  [97.3 F (36.3 C)-98.4 F (36.9 C)] 98.2 F (36.8 C) (09/17 0540) Pulse Rate:  [48-74] 74 (09/17 0540) Resp:  [11-23] 20 (09/17 0540) BP: (114-158)/(60-88) 151/86 mmHg (09/17 0540) SpO2:  [98 %-100 %] 100 % (09/17 0540) Weight:  [92.987 kg (205 lb)] 92.987 kg (205 lb) (09/16 1405)  Intake/Output from previous day:  Intake/Output Summary (Last 24 hours) at 01/17/14 0819 Last data filed at 01/17/14 0654  Gross per 24 hour  Intake   5325 ml  Output   3105 ml  Net   2220 ml    Labs:  Recent Labs  01/17/14 0530  HGB 10.9*    Recent Labs  01/17/14 0530  WBC 15.2*  RBC 3.82*  HCT 34.1*  PLT 296    Recent Labs  01/17/14 0530  NA 141  K 4.4  CL 106  CO2 25  BUN 7  CREATININE 0.57  GLUCOSE 132*  CALCIUM 9.1   No results found for this basename: LABPT, INR,  in the last 72 hours  EXAM General - Patient is Alert, Appropriate and Oriented Extremity - Neurovascular intact Sensation intact distally Dressing - dressing C/D/I Motor Function - intact, moving foot and toes well on exam.  Hemovac pulled without difficulty.  Past Medical History  Diagnosis Date  . Hypertension   . Hyperlipidemia   . Heart murmur     hx of   . Depression   . Arthritis     Assessment/Plan: 1 Day Post-Op Procedure(s) (LRB): LEFT TOTAL HIP ARTHROPLASTY ANTERIOR APPROACH (Left) Principal Problem:   OA (osteoarthritis) of hip  Estimated body mass index is 34.11 kg/(m^2) as calculated from the following:   Height as of this encounter:  (1.651 m).   Weight as of this encounter: 92.987  kg (205 lb). Advance diet Up with therapy Plan for discharge tomorrow Discharge home with home health  DVT Prophylaxis - Xarelto Weight Bearing As Tolerated left Leg Hemovac Pulled Begin Therapy  Avel Peace, PA-C Orthopaedic Surgery 01/17/2014, 8:19 AM

## 2014-01-17 NOTE — Progress Notes (Signed)
[facility]  [facility] is providing the following services: RW and Commode  If patient discharges after hours, please call [phone].   Renard Hamper 01/17/2014, 11:29 AM

## 2014-01-17 NOTE — Progress Notes (Signed)
Physical Therapy Treatment Patient Details Name: Tammy [name] MRN: [id number] DOB: [birth year] Today's Date: 01/17/2014    History of Present Illness [name]    PT Comments    POD # 2 pm session.  Assisted pt out of recliner to amb greater distance then to bathroom.  Pt demon increased ability to self rise and demon good safety cognition.  Assisted back to bed per pt request then applied ICE.   Pt feeling better about her mobility and plans to D/C to home vs SNF.  Will update LPT.  Follow Up Recommendations  [facility] PT     Equipment Recommendations       Recommendations for Other Services       Precautions / Restrictions Precautions Precautions: Fall Restrictions Weight Bearing Restrictions: No    Mobility  Bed Mobility    MinGuard assist B LE up onto bed plus increased time           General bed mobility comments: Pt OOB in recliner  Transfers Overall transfer level: Needs assistance Equipment used: Rolling walker (2 wheeled) Transfers: Sit to/from Stand Sit to Stand: Min assist         General transfer comment: cues for hand placement plus increased time  Ambulation/Gait Ambulation/Gait assistance: Min assist Ambulation Distance (Feet): 35 Feet Assistive device: Rolling walker (2 wheeled) Gait Pattern/deviations: Step-to pattern Gait velocity: decreased   General Gait Details: 75% VC's on proper sequencing and increased time   Stairs            Wheelchair Mobility    Modified Rankin (Stroke Patients Only)       Balance                                    Cognition Arousal/Alertness: Awake/alert Behavior During Therapy: WFL for tasks assessed/performed Overall Cognitive Status: Within Functional Limits for tasks assessed                      Exercises      General Comments        Pertinent Vitals/Pain Pain Assessment: 0-10 Pain Score: 5  Pain Location: L hip Pain Descriptors / Indicators: Aching Pain  Intervention(s): Repositioned;Ice applied    Home Living Family/patient expects to be discharged to:: Private residence Living Arrangements: Children;Other relatives Available Help at Discharge: Available PRN/intermittently Type of Home: Apartment Home Access: Stairs to enter Entrance Stairs-Rails: Right;Left   Home Equipment: Cane - single point      Prior Function Level of Independence: Independent with assistive device(s)          PT Goals (current goals can now be found in the care plan section) Acute Rehab PT Goals Patient Stated Goal: to go home, but maybe I need  rehab Progress towards PT goals: Progressing toward goals    Frequency  7X/week    PT Plan      Co-evaluation             End of Session Equipment Utilized During Treatment: Gait belt Activity Tolerance: Patient limited by fatigue;Patient limited by pain Patient left: in chair;with call bell/phone within reach     Time: 1410-1435 PT Time Calculation (min): 25 min  Charges:  $Gait Training: 8-22 mins $Therapeutic Activity: 8-22 mins                    G Codes:  Rica Koyanagi  PTA [facility]  Acute  Rehab Pager      [phone]

## 2014-01-18 LAB — BASIC METABOLIC PANEL
ANION GAP: 11 (ref 5–15)
BUN: 11 mg/dL (ref 6–23)
CALCIUM: 9 mg/dL (ref 8.4–10.5)
CO2: 24 mEq/L (ref 19–32)
CREATININE: 0.53 mg/dL (ref 0.50–1.10)
Chloride: 106 mEq/L (ref 96–112)
GFR calc Af Amer: >90 mL/min (ref >90)
GFR calc non Af Amer: >90 mL/min (ref >90)
Glucose, Bld: 143 mg/dL — ABNORMAL HIGH (ref 70–99)
Potassium: 3.5 mEq/L — ABNORMAL LOW (ref 3.7–5.3)
SODIUM: 141 meq/L (ref 137–147)

## 2014-01-18 LAB — CBC
HCT: 32.2 % — ABNORMAL LOW (ref 36.0–46.0)
Hemoglobin: 10.7 g/dL — ABNORMAL LOW (ref 12.0–15.0)
MCH: 29 pg (ref 26.0–34.0)
MCHC: 33.2 g/dL (ref 30.0–36.0)
MCV: 87.3 fL (ref 78.0–100.0)
PLATELETS: 290 10*3/uL (ref 150–400)
RBC: 3.69 MIL/uL — ABNORMAL LOW (ref 3.87–5.11)
RDW: 14.4 % (ref 11.5–15.5)
WBC: 17.6 10*3/uL — ABNORMAL HIGH (ref 4.0–10.5)

## 2014-01-18 MED ORDER — RIVAROXABAN 10 MG PO TABS
10.0000 mg | ORAL_TABLET | Freq: Every day | ORAL | Status: DC
Start: 2014-01-18 — End: 2015-03-06

## 2014-01-18 MED ORDER — TRAMADOL HCL 50 MG PO TABS: 50.0000 mg | ORAL_TABLET | Freq: Four times a day (QID) | ORAL | Status: DC | PRN

## 2014-01-18 MED ORDER — OXYCODONE HCL 5 MG PO TABS: 5.0000 mg | ORAL_TABLET | ORAL | Status: DC | PRN

## 2014-01-18 MED ORDER — METHOCARBAMOL 500 MG PO TABS: 500.0000 mg | ORAL_TABLET | Freq: Four times a day (QID) | ORAL | Status: DC | PRN

## 2014-01-18 NOTE — Progress Notes (Signed)
Physical Therapy Treatment Patient Details Name: CARLINDA OHLSON MRN: [id number] DOB: Dec 23, 1950 Today's Date: 2014/01/21    History of Present Illness [name]    PT Comments    Pt now plans to d/c home and states daughter lives with her.  Pt will need RW and would benefit from ambulance transport home due to living in second floor apt.  Pt mobilizes well and safely.  Plans for another session prior to d/c.   Follow Up Recommendations  Home health PT     Equipment Recommendations  Rolling walker with 5" wheels    Recommendations for Other Services       Precautions / Restrictions Precautions Precautions: Fall    Mobility  Bed Mobility               General bed mobility comments: pt ambulating out of bathroom on arrival  Transfers Overall transfer level: Needs assistance Equipment used: Rolling walker (2 wheeled) Transfers: Sit to/from Stand Sit to Stand: Min guard         General transfer comment: verbal cues for hand placement and taking RW back to recliner  Ambulation/Gait Ambulation/Gait assistance: Min guard Ambulation Distance (Feet): 160 Feet Assistive device: Rolling walker (2 wheeled) Gait Pattern/deviations: Step-to pattern;Antalgic Gait velocity: decreased   General Gait Details: pt with decreased hip/knee flexion L LE and compensates with bil heel raise to initiate L swing, cues to correct this gait pattern however difficult due to hip flexion weakness at this time   Stairs            Wheelchair Mobility    Modified Rankin (Stroke Patients Only)       Balance                                    Cognition Arousal/Alertness: Awake/alert Behavior During Therapy: WFL for tasks assessed/performed Overall Cognitive Status: Within Functional Limits for tasks assessed                      Exercises Total Joint Exercises Ankle Circles/Pumps: AROM;Both;15 reps Quad Sets: AROM;Both;15 reps Towel Squeeze:  AROM;Both;15 reps Heel Slides: AAROM;Left;15 reps Hip ABduction/ADduction: AAROM;Left;15 reps Straight Leg Raises: AAROM;Left;15 reps Long Arc Quad: AROM;Left;15 reps    General Comments        Pertinent Vitals/Pain Pain Assessment: 0-10 Pain Score: 3  Pain Location: L hip Pain Descriptors / Indicators: Aching;Sore Pain Intervention(s): Monitored during session;Premedicated before session;Repositioned    Home Living                      Prior Function            PT Goals (current goals can now be found in the care plan section) Progress towards PT goals: Progressing toward goals    Frequency  7X/week    PT Plan Current plan remains appropriate    Co-evaluation             End of Session   Activity Tolerance: Patient tolerated treatment well Patient left: in chair;with call bell/phone within reach     Time: 0858-0922 PT Time Calculation (min): 24 min  Charges:  $Gait Training: 8-22 mins $Therapeutic Exercise: 8-22 mins                    G Codes:      Zeda Gangwer,KATHrine E 2014-01-21, 11:36 AM Zenovia Jarred,  PT, DPT 01/18/2014 Pager: [phone]

## 2014-01-18 NOTE — Progress Notes (Signed)
Occupational Therapy Treatment Patient Details Name: Tammy [name] MRN: [id number] DOB: 10/10/1950 Today's Date: 01/18/2014    History of present illness [name]   OT comments  Pt demonstrates good safety awareness.  Requires supervision for BADLs.   Follow Up Recommendations  Supervision - Intermittent;No OT follow up    Equipment Recommendations  3 in 1 bedside comode    Recommendations for Other Services      Precautions / Restrictions Precautions Precautions: Fall       Mobility Bed Mobility                  Transfers Overall transfer level: Modified independent                    Balance                                   ADL       Grooming: Wash/dry hands;Wash/dry face;Supervision/safety;Standing   Upper Body Bathing: Supervision/ safety;Sitting   Lower Body Bathing: Supervison/ safety;Sit to/from stand   Upper Body Dressing : Set up   Lower Body Dressing: Supervision/safety;Sit to/from stand   Toilet Transfer: Supervision/safety;Ambulation;RW   Toileting- Clothing Manipulation and Hygiene: Supervision/safety;Sit to/from stand         General ADL Comments: Pt demonstrates good safety awareness      Vision                     Perception     Praxis      Cognition   Behavior During Therapy: WFL for tasks assessed/performed Overall Cognitive Status: Within Functional Limits for tasks assessed                       Extremity/Trunk Assessment               Exercises     Shoulder Instructions       General Comments      Pertinent Vitals/ Pain       Pain Assessment: 0-10 Pain Score: 4  Pain Location: Lt hip Pain Descriptors / Indicators: Aching Pain Intervention(s): Monitored during session;Premedicated before session  Home Living                                          Prior Functioning/Environment              Frequency Min 2X/week     Progress  Toward Goals  OT Goals(current goals can now be found in the care plan section)  Progress towards OT goals: Progressing toward goals  ADL Goals Pt Will Perform Grooming: with supervision;standing Pt Will Transfer to Toilet: with supervision;ambulating;bedside commode Pt Will Perform Toileting - Clothing Manipulation and hygiene: with supervision;sit to/from stand  Plan Discharge plan needs to be updated    Co-evaluation                 End of Session Equipment Utilized During Treatment: Rolling walker   Activity Tolerance Patient tolerated treatment well   Patient Left in chair;with call bell/phone within reach   Nurse Communication Mobility status        Time: 7829-5621 OT Time Calculation (min): 24 min  Charges: OT General Charges $OT Visit: 1 Procedure OT Treatments $Self Care/Home Management :  23-37 mins  [name] M 01/18/2014, 9:45 PM

## 2014-01-18 NOTE — Progress Notes (Signed)
   Subjective: 2 Days Post-Op Procedure(s) (LRB): LEFT TOTAL HIP ARTHROPLASTY ANTERIOR APPROACH (Left) Patient reports pain as moderate.   Patient seen in rounds with Dr. Lequita Halt. Doing much better, deep pain improved. Patient is well, and has had no acute complaints or problems Patient is ready to go home today after therapy.  Objective: Vital signs in last 24 hours: Temp:  [97.6 F (36.4 C)-99.5 F (37.5 C)] 97.6 F (36.4 C) (09/18 0646) Pulse Rate:  [70-84] 70 (09/18 0646) Resp:  [16-18] 16 (09/18 0646) BP: (135-145)/(63-75) 145/63 mmHg (09/18 0646) SpO2:  [98 %-100 %] 100 % (09/18 0646)  Intake/Output from previous day:  Intake/Output Summary (Last 24 hours) at 01/18/14 0900 Last data filed at 01/18/14 0647  Gross per 24 hour  Intake 805.83 ml  Output   1275 ml  Net -469.17 ml    Intake/Output this shift:    Labs:  Recent Labs  01/17/14 0530 01/18/14 0450  HGB 10.9* 10.7*    Recent Labs  01/17/14 0530 01/18/14 0450  WBC 15.2* 17.6*  RBC 3.82* 3.69*  HCT 34.1* 32.2*  PLT 296 290    Recent Labs  01/17/14 0530 01/18/14 0450  NA 141 141  K 4.4 3.5*  CL 106 106  CO2 25 24  BUN 7 11  CREATININE 0.57 0.53  GLUCOSE 132* 143*  CALCIUM 9.1 9.0   No results found for this basename: LABPT, INR,  in the last 72 hours  EXAM: General - Patient is Alert, Appropriate and Oriented Extremity - Neurovascular intact Sensation intact distally Dorsiflexion/Plantar flexion intact Incision - clean, dry, no drainage, healing Motor Function - intact, moving foot and toes well on exam.   Assessment/Plan: 2 Days Post-Op Procedure(s) (LRB): LEFT TOTAL HIP ARTHROPLASTY ANTERIOR APPROACH (Left) Procedure(s) (LRB): LEFT TOTAL HIP ARTHROPLASTY ANTERIOR APPROACH (Left) Past Medical History  Diagnosis Date  . Hypertension   . Hyperlipidemia   . Heart murmur     hx of   . Depression   . Arthritis    Principal Problem:   OA (osteoarthritis) of hip  Estimated  body mass index is 34.11 kg/(m^2) as calculated from the following:   Height as of this encounter:  (1.651 m).   Weight as of this encounter: 92.987 kg (205 lb). Up with therapy Discharge home with home health Diet - Cardiac diet Follow up - in 2 weeks Activity - WBAT Disposition - Home Condition Upon Discharge - Good D/C Meds - See DC Summary DVT Prophylaxis - Xarelto  Avel Peace, PA-C Orthopaedic Surgery 01/18/2014, 9:00 AM

## 2014-01-18 NOTE — Discharge Summary (Signed)
Physician Discharge Summary   Patient ID: Tammy [name] MRN: [id number] DOB/AGE: [birth year] 63 y.o.  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  Admission Diagnoses:  Past Medical History  Diagnosis Date  . Hypertension   . Hyperlipidemia   . Heart murmur     hx of   . Depression   . Arthritis    Discharge Diagnoses:   Principal Problem:   OA (osteoarthritis) of hip  Estimated body mass index is 34.11 kg/(m^2) as calculated from the following:   Height as of this encounter: 5' 5" (1.651 m).   Weight as of this encounter: 92.987 kg (205 lb).  Procedure(s) (LRB): LEFT TOTAL HIP ARTHROPLASTY ANTERIOR APPROACH (Left)   Consults: None  HPI: Tammy [name] is a 63 y.o. female who has advanced end-  stage arthritis of her Left hip with progressively worsening pain and  dysfunction.The patient has failed nonoperative management and presents for  total hip arthroplasty.   Laboratory Data: Admission on 01/16/2014  Component Date Value Ref Range Status  . ABO/RH(D) 01/16/2014 O POS   Final  . Antibody Screen 01/16/2014 NEG   Final  . Sample Expiration 01/16/2014 01/19/2014   Final  . ABO/RH(D) 01/16/2014 O POS   Final  . WBC 01/17/2014 15.2* 4.0 - 10.5 K/uL Final  . RBC 01/17/2014 3.82* 3.87 - 5.11 MIL/uL Final  . Hemoglobin 01/17/2014 10.9* 12.0 - 15.0 g/dL Final  . HCT 01/17/2014 34.1* 36.0 - 46.0 % Final  . MCV 01/17/2014 89.3  78.0 - 100.0 fL Final  . MCH 01/17/2014 28.5  26.0 - 34.0 pg Final  . MCHC 01/17/2014 32.0  30.0 - 36.0 g/dL Final  . RDW 01/17/2014 14.3  11.5 - 15.5 % Final  . Platelets 01/17/2014 296  150 - 400 K/uL Final  . Sodium 01/17/2014 141  137 - 147 mEq/L Final  . Potassium 01/17/2014 4.4  3.7 - 5.3 mEq/L Final  . Chloride 01/17/2014 106  96 - 112 mEq/L Final  . CO2 01/17/2014 25  19 - 32 mEq/L Final  . Glucose, Bld 01/17/2014 132* 70 - 99 mg/dL Final  . BUN  01/17/2014 7  6 - 23 mg/dL Final  . Creatinine, Ser 01/17/2014 0.57  0.50 - 1.10 mg/dL Final  . Calcium 01/17/2014 9.1  8.4 - 10.5 mg/dL Final  . GFR calc non Af Amer 01/17/2014 >90  >90 mL/min Final  . GFR calc Af Amer 01/17/2014 >90  >90 mL/min Final   Comment: (NOTE)                          The eGFR has been calculated using the CKD EPI equation.                          This calculation has not been validated in all clinical situations.                          eGFR's persistently <90 mL/min signify possible Chronic Kidney                          Disease.  . Anion gap 01/17/2014 10  5 - 15 Final  . WBC 01/18/2014 17.6* 4.0 - 10.5 K/uL Final  . RBC 01/18/2014 3.69* 3.87 - 5.11 MIL/uL Final  .  Hemoglobin 01/18/2014 10.7* 12.0 - 15.0 g/dL Final  . HCT 01/18/2014 32.2* 36.0 - 46.0 % Final  . MCV 01/18/2014 87.3  78.0 - 100.0 fL Final  . MCH 01/18/2014 29.0  26.0 - 34.0 pg Final  . MCHC 01/18/2014 33.2  30.0 - 36.0 g/dL Final  . RDW 01/18/2014 14.4  11.5 - 15.5 % Final  . Platelets 01/18/2014 290  150 - 400 K/uL Final  . Sodium 01/18/2014 141  137 - 147 mEq/L Final  . Potassium 01/18/2014 3.5* 3.7 - 5.3 mEq/L Final   Comment: DELTA CHECK NOTED                          REPEATED TO VERIFY  . Chloride 01/18/2014 106  96 - 112 mEq/L Final  . CO2 01/18/2014 24  19 - 32 mEq/L Final  . Glucose, Bld 01/18/2014 143* 70 - 99 mg/dL Final  . BUN 01/18/2014 11  6 - 23 mg/dL Final  . Creatinine, Ser 01/18/2014 0.53  0.50 - 1.10 mg/dL Final  . Calcium 01/18/2014 9.0  8.4 - 10.5 mg/dL Final  . GFR calc non Af Amer 01/18/2014 >90  >90 mL/min Final  . GFR calc Af Amer 01/18/2014 >90  >90 mL/min Final   Comment: (NOTE)                          The eGFR has been calculated using the CKD EPI equation.                          This calculation has not been validated in all clinical situations.                          eGFR's persistently <90 mL/min signify possible Chronic Kidney                           Disease.  Georgiann Hahn gap 01/18/2014 11  5 - 15 Final  Hospital Outpatient Visit on 01/09/2014  Component Date Value Ref Range Status  . aPTT 01/09/2014 40* 24 - 37 seconds Final   Comment:                                 IF BASELINE aPTT IS ELEVATED,                          SUGGEST PATIENT RISK ASSESSMENT                          BE USED TO DETERMINE APPROPRIATE                          ANTICOAGULANT THERAPY.  . WBC 01/09/2014 14.0* 4.0 - 10.5 K/uL Final  . RBC 01/09/2014 4.40  3.87 - 5.11 MIL/uL Final  . Hemoglobin 01/09/2014 12.9  12.0 - 15.0 g/dL Final  . HCT 01/09/2014 38.9  36.0 - 46.0 % Final  . MCV 01/09/2014 88.4  78.0 - 100.0 fL Final  . MCH 01/09/2014 29.3  26.0 - 34.0 pg Final  . MCHC 01/09/2014 33.2  30.0 - 36.0 g/dL Final  . RDW 01/09/2014 13.9  11.5 - 15.5 %  Final  . Platelets 01/09/2014 352  150 - 400 K/uL Final  . Sodium 01/09/2014 136* 137 - 147 mEq/L Final  . Potassium 01/09/2014 3.2* 3.7 - 5.3 mEq/L Final  . Chloride 01/09/2014 96  96 - 112 mEq/L Final  . CO2 01/09/2014 26  19 - 32 mEq/L Final  . Glucose, Bld 01/09/2014 87  70 - 99 mg/dL Final  . BUN 01/09/2014 14  6 - 23 mg/dL Final  . Creatinine, Ser 01/09/2014 0.71  0.50 - 1.10 mg/dL Final  . Calcium 01/09/2014 9.7  8.4 - 10.5 mg/dL Final  . Total Protein 01/09/2014 7.9  6.0 - 8.3 g/dL Final  . Albumin 01/09/2014 4.1  3.5 - 5.2 g/dL Final  . AST 01/09/2014 24  0 - 37 U/L Final  . ALT 01/09/2014 20  0 - 35 U/L Final  . Alkaline Phosphatase 01/09/2014 117  39 - 117 U/L Final  . Total Bilirubin 01/09/2014 0.8  0.3 - 1.2 mg/dL Final  . GFR calc non Af Amer 01/09/2014 >90  >90 mL/min Final  . GFR calc Af Amer 01/09/2014 >90  >90 mL/min Final   Comment: (NOTE)                          The eGFR has been calculated using the CKD EPI equation.                          This calculation has not been validated in all clinical situations.                          eGFR's persistently <90 mL/min signify possible Chronic  Kidney                          Disease.  . Anion gap 01/09/2014 14  5 - 15 Final  . Prothrombin Time 01/09/2014 14.0  11.6 - 15.2 seconds Final  . INR 01/09/2014 1.08  0.00 - 1.49 Final  . Color, Urine 01/09/2014 AMBER* YELLOW Final   BIOCHEMICALS MAY BE AFFECTED BY COLOR  . APPearance 01/09/2014 CLOUDY* CLEAR Final  . Specific Gravity, Urine 01/09/2014 1.024  1.005 - 1.030 Final  . pH 01/09/2014 5.5  5.0 - 8.0 Final  . Glucose, UA 01/09/2014 NEGATIVE  NEGATIVE mg/dL Final  . Hgb urine dipstick 01/09/2014 NEGATIVE  NEGATIVE Final  . Bilirubin Urine 01/09/2014 SMALL* NEGATIVE Final  . Ketones, ur 01/09/2014 NEGATIVE  NEGATIVE mg/dL Final  . Protein, ur 01/09/2014 NEGATIVE  NEGATIVE mg/dL Final  . Urobilinogen, UA 01/09/2014 0.2  0.0 - 1.0 mg/dL Final  . Nitrite 01/09/2014 NEGATIVE  NEGATIVE Final  . Leukocytes, UA 01/09/2014 SMALL* NEGATIVE Final  . MRSA, PCR 01/09/2014 NEGATIVE  NEGATIVE Final  . Staphylococcus aureus 01/09/2014 NEGATIVE  NEGATIVE Final   Comment:                                 The Xpert SA Assay (FDA                          approved for NASAL specimens                          in patients over 21 years of  age),                          is one component of                          a comprehensive surveillance                          program.  Test performance has                          been validated by [facility] for patients greater                          than or equal to 23 year old.                          It is not intended                          to diagnose infection nor to                          guide or monitor treatment.  . Squamous Epithelial / LPF 01/09/2014 MANY* RARE Final  . WBC, UA 01/09/2014 7-10  <3 WBC/hpf Final  . RBC / HPF 01/09/2014 0-2  <3 RBC/hpf Final  . Bacteria, UA 01/09/2014 MANY* RARE Final  . Casts 01/09/2014 HYALINE CASTS* NEGATIVE Final  . Urine-Other 01/09/2014 MUCOUS PRESENT   Final      X-Rays:Dg Chest 2 View  01/09/2014   CLINICAL DATA:  Preop.  Left hip surgery.  Hypertension.  EXAM: CHEST  2 VIEW  COMPARISON:  None.  FINDINGS: The cardiomediastinal silhouette is within normal limits. The lungs are well inflated and clear. There is no evidence of pleural effusion or pneumothorax. No acute osseous abnormality is identified.  IMPRESSION: No active cardiopulmonary disease.   Electronically Signed   By: Logan Bores   On: 01/09/2014 17:52   Dg Hip Complete Left  01/09/2014   CLINICAL DATA:  Preoperative evaluation for LEFT hip surgery, history hypertension  EXAM: LEFT HIP - COMPLETE 2+ VIEW  COMPARISON:  None  FINDINGS: Osseous mineralization normal.  SI joints symmetric and preserved.  Severe BILATERAL osteoarthritic changes of the hip joints with joint space narrowing, spur formation and subchondral sclerosis.  Mild acetabulum protrusio bilaterally.  No acute fracture, dislocation or bone destruction.  IMPRESSION: Advanced osteoarthritic changes of BILATERAL hip joints.   Electronically Signed   By: Lavonia Dana M.D.   On: 01/09/2014 17:54   Dg Pelvis Portable  01/16/2014   CLINICAL DATA:  Status post total hip replacement  EXAM: DG C-ARM 1-60 MIN - NRPT [facility]; PORTABLE PELVIS 1-2 VIEWS  COMPARISON:  None.  FINDINGS: Patient is status post total hip arthroplasty. Prosthetic components appear well seated. No fracture or dislocation. There is moderate osteoarthritic change in the right hip joint.  IMPRESSION: Prosthesis appears well-seated. Osteoarthritic change right hip joint. No fracture or dislocation.   Electronically Signed   By: Lowella Grip M.D.   On: 01/16/2014 13:44  Dg C-arm 1-60 Min-no Report  01/16/2014   CLINICAL DATA:  Status post total hip replacement  EXAM: DG C-ARM 1-60 MIN - NRPT [facility]; PORTABLE PELVIS 1-2 VIEWS  COMPARISON:  None.  FINDINGS: Patient is status post total hip arthroplasty. Prosthetic components appear well seated. No fracture or dislocation. There is  moderate osteoarthritic change in the right hip joint.  IMPRESSION: Prosthesis appears well-seated. Osteoarthritic change right hip joint. No fracture or dislocation.   Electronically Signed   By: Lowella Grip M.D.   On: 01/16/2014 13:44    EKG: Orders placed during the hospital encounter of 01/09/14  . EKG 12-LEAD  . EKG 12-LEAD     Hospital Course: Patient was admitted to [facility] and taken to the OR and underwent the above state procedure without complications.  Patient tolerated the procedure well and was later transferred to the recovery room and then to the orthopaedic floor for postoperative care.  They were given PO and IV analgesics for pain control following their surgery.  They were given 24 hours of postoperative antibiotics of  Anti-infectives   Start     Dose/Rate Route Frequency Ordered Stop   01/16/14 1800  ceFAZolin (ANCEF) IVPB 2 g/50 mL premix     2 g 100 mL/hr over 30 Minutes Intravenous Every 6 hours 01/16/14 1409 01/17/14 0011   01/16/14 0737  ceFAZolin (ANCEF) IVPB 2 g/50 mL premix     2 g 100 mL/hr over 30 Minutes Intravenous On call to O.R. 01/16/14 5885 01/16/14 1059     and started on DVT prophylaxis in the form of Xarelto.   PT and OT were ordered for total hip protocol.  The patient was allowed to be WBAT with therapy. Discharge planning was consulted to help with postop disposition and equipment needs.  Patient had a decent night on the evening of surgery.  They started to get up OOB with therapy on day one.  Hemovac drain was pulled without difficulty.  Continued to work with therapy into day two.  Dressing was changed on day two and the incision was healing well.   Patient was seen in rounds by Dr. Wynelle Link and was ready to go home after her therapy sessions.  Discharge home with home health  Diet - Cardiac diet  Follow up - in 2 weeks  Activity - WBAT  Disposition - Home  Condition Upon Discharge - Good  D/C Meds - See DC Summary  DVT  Prophylaxis - Xarelto   Discharge Instructions   Call MD / Call 911    Complete by:  As directed   If you experience chest pain or shortness of breath, CALL 911 and be transported to the hospital emergency room.  If you develope a fever above 101 F, pus (white drainage) or increased drainage or redness at the wound, or calf pain, call your surgeon's office.     Change dressing    Complete by:  As directed   You may change your dressing dressing daily with sterile 4 x 4 inch gauze dressing and paper tape.  Do not submerge the incision under water.     Constipation Prevention    Complete by:  As directed   Drink plenty of fluids.  Prune juice may be helpful.  You may use a stool softener, such as Colace (over the counter) 100 mg twice a day.  Use MiraLax (over the counter) for constipation as needed.     Diet - low sodium heart healthy  Complete by:  As directed      Discharge instructions    Complete by:  As directed   Pick up stool softner and laxative for home. Do not submerge incision under water. May shower. Continue to use ice for pain and swelling from surgery.  Total Hip Protocol.  Take Xarelto for two and a half more weeks, then discontinue Xarelto. Once the patient has completed the Xarelto, they may resume the 81 mg Aspirin.     Do not sit on low chairs, stoools or toilet seats, as it may be difficult to get up from low surfaces    Complete by:  As directed      Driving restrictions    Complete by:  As directed   No driving until released by the physician.     Increase activity slowly as tolerated    Complete by:  As directed      Lifting restrictions    Complete by:  As directed   No lifting until released by the physician.     Patient may shower    Complete by:  As directed   You may shower without a dressing once there is no drainage.  Do not wash over the wound.  If drainage remains, do not shower until drainage stops.     TED hose    Complete by:  As directed    Use stockings (TED hose) for 3 weeks on both leg(s).  You may remove them at night for sleeping.     Weight bearing as tolerated    Complete by:  As directed   Laterality:  left  Extremity:  Lower            Medication List    STOP taking these medications       aspirin 81 MG tablet     ibuprofen 200 MG tablet  Commonly known as:  ADVIL,MOTRIN     naproxen sodium 550 MG tablet  Commonly known as:  ANAPROX      TAKE these medications       atorvastatin 40 MG tablet  Commonly known as:  LIPITOR  Take 40 mg by mouth daily.     escitalopram 10 MG tablet  Commonly known as:  LEXAPRO  Take 10 mg by mouth every evening.     felodipine 10 MG 24 hr tablet  Commonly known as:  PLENDIL  Take 10 mg by mouth daily.     lisinopril-hydrochlorothiazide 20-12.5 MG per tablet  Commonly known as:  PRINZIDE,ZESTORETIC  Take 1 tablet by mouth every evening.     methocarbamol 500 MG tablet  Commonly known as:  ROBAXIN  Take 1 tablet (500 mg total) by mouth every 6 (six) hours as needed for muscle spasms.     metoprolol succinate 25 MG 24 hr tablet  Commonly known as:  TOPROL-XL  Take 25 mg by mouth every evening.     oxyCODONE 5 MG immediate release tablet  Commonly known as:  Oxy IR/ROXICODONE  Take 1-2 tablets (5-10 mg total) by mouth every 3 (three) hours as needed for moderate pain, severe pain or breakthrough pain.     rivaroxaban 10 MG Tabs tablet  Commonly known as:  XARELTO  - Take 1 tablet (10 mg total) by mouth daily with breakfast. Take Xarelto for two and a half more weeks, then discontinue Xarelto.  - Once the patient has completed the Xarelto, they may resume the 81 mg Aspirin.     traMADol 50  MG tablet  Commonly known as:  ULTRAM  Take 1-2 tablets (50-100 mg total) by mouth every 6 (six) hours as needed for severe pain (mild pain).           Follow-up Information   Follow up with [facility]. (home health physical therapy)    Contact information:     [address] [place] [address] [phone]       Follow up with Gearlean Alf, MD. Schedule an appointment as soon as possible for a visit on 01/29/2014. (Call office at [phone] to set up follow up appointment on 01/29/2014.)    Specialty:  Orthopedic Surgery   Contact information:   [address] [place] [address] [phone]       Signed: Arlee Muslim, PA-C Orthopaedic Surgery 01/18/2014, 9:05 AM

## 2014-01-18 NOTE — Progress Notes (Signed)
Physical Therapy Treatment Note   01/18/14 1500  PT Visit Information  Last PT Received On 01/18/14  Assistance Needed +1  History of Present Illness [name]  PT Time Calculation  PT Start Time 1347  PT Stop Time 1359  PT Time Calculation (min) 12 min  Subjective Data  Subjective Pt ambulated in hallway focusing on improving gait pattern.  Pt to d/c home today and had no further questions/concerns.  Precautions  Precautions Fall  Pain Assessment  Pain Assessment 0-10  Pain Score 2  Pain Descriptors / Indicators Aching;Sore  Pain Intervention(s) Limited activity within patient's tolerance;Monitored during session;Repositioned  Cognition  Arousal/Alertness Awake/alert  Behavior During Therapy WFL for tasks assessed/performed  Overall Cognitive Status Within Functional Limits for tasks assessed  Transfers  Overall transfer level Modified independent  Ambulation/Gait  Ambulation/Gait assistance Supervision  Ambulation Distance (Feet) 160 Feet  Assistive device Rolling walker (2 wheeled)  Gait Pattern/deviations Step-through pattern;Decreased stride length;Antalgic  Gait velocity decreased  General Gait Details decreased L hip/knee flexion however decreased bil heel raise this afternoon to compensate, also educated pt on step together pattern for safety using unaffected LE first since pt having difficulty advancing L LE, pt able to perform either LE first safely with RW  PT - End of Session  Activity Tolerance Patient tolerated treatment well  Patient left in chair;with call bell/phone within reach  PT - Assessment/Plan  PT Plan Current plan remains appropriate  PT Frequency 7X/week  Follow Up Recommendations Home health PT  PT equipment Rolling walker with 5" wheels  PT Goal Progression  Progress towards PT goals Progressing toward goals  PT General Charges  $$ ACUTE PT VISIT 1 Procedure  PT Treatments  $Gait Training 8-22 mins   Zenovia Jarred, PT, DPT 01/18/2014 Pager:  [phone]

## 2014-01-18 NOTE — Progress Notes (Signed)
Came back to bedside to pick up [facility] packet for HTN management. Agreeable to post hospital discharge call. States she will also like to follow up with Link to [facility] Coordinator post discharge. Contact information and packet left at bedside.  Raiford Noble, MSN- RN,BSN- [facility] [phone]

## 2014-01-18 NOTE — Progress Notes (Signed)
Clinical Social Work  Patient ready to DC home with [facility]. Patient requesting PTAR take her home and is aware of no guarantee of payment. CSW verified address and placed PTAR paperwork on chart. PTAR request #: [id number].  CSW is signing off but available if needed.  Unk Lightning, LCSW (Coverage for [employer])

## 2014-07-02 ENCOUNTER — Ambulatory Visit: Payer: 59

## 2015-02-25 ENCOUNTER — Ambulatory Visit: Payer: Self-pay | Admitting: Orthopedic Surgery

## 2015-02-25 NOTE — Progress Notes (Signed)
Preoperative surgical orders have been place into the Epic hospital system for Tammy [name] on 02/25/2015, 5:37 PM  by Patrica DuelPERKINS, ALEXZANDREW for surgery on 03-19-15.  Preop Total Hip - Anterior Approach orders including IV Tylenol, and IV Decadron as long as there are no contraindications to the above medications. [name], PA-C

## 2015-03-10 NOTE — Patient Instructions (Addendum)
Katharina Capermanda L Pulse  03/10/2015   Your procedure is scheduled on:   03/19/2015    [facility]  Entrance take [address]  elevators to 3rd floor to  Short Stay Center at    0630 AM.  Call this number if you have problems the morning of surgery [phone]   Remember: ONLY 1 PERSON MAY GO WITH YOU TO SHORT STAY TO GET  READY MORNING OF YOUR SURGERY.  Do not eat food or drink liquids :After Midnight.     Take these medicines the morning of surgery with A SIP OF WATER: none                                 You may not have any metal on your body including hair pins and              piercings  Do not wear jewelry, make-up, lotions, powders or perfumes, deodorant             Do not wear nail polish.  Do not shave  48 hours prior to surgery.            Do not bring valuables to the hospital. [place] IS NOT             RESPONSIBLE   FOR VALUABLES.  Contacts, dentures or bridgework may not be worn into surgery.  Leave suitcase in the car. After surgery it may be brought to your room.        Special Instructions: coughing and deep breathing exercises, leg exercises               Please read over the following fact sheets you were given: _____________________________________________________________________             [facility] Pre[facility] Before surgery, you can play an important role.  Because skin is not sterile, your skin needs to be as free of germs as possible.  You can reduce the number of germs on your skin by washing with CHG (chlorahexidine gluconate) soap before surgery.  CHG is an antiseptic cleaner which kills germs and bonds with the skin to continue killing germs even after washing. Please DO NOT use if you have an allergy to CHG or antibacterial soaps.  If your skin becomes reddened/irritated stop using the CHG and inform your nurse when you arrive at Short Stay. Do not shave (including legs and underarms) for at least 48  hours prior to the first CHG shower.  You may shave your face/neck. Please follow these instructions carefully:  1.  Shower with CHG Soap the night before surgery and the  morning of Surgery.  2.  If you choose to wash your hair, wash your hair first as usual with your  normal  shampoo.  3.  After you shampoo, rinse your hair and body thoroughly to remove the  shampoo.                           4.  Use CHG as you would any other liquid soap.  You can apply chg directly  to the skin and wash                       Gently with a  scrungie or clean washcloth.  5.  Apply the CHG Soap to your body ONLY FROM THE NECK DOWN.   Do not use on face/ open                           Wound or open sores. Avoid contact with eyes, ears mouth and genitals (private parts).                       Wash face,  Genitals (private parts) with your normal soap.             6.  Wash thoroughly, paying special attention to the area where your surgery  will be performed.  7.  Thoroughly rinse your body with warm water from the neck down.  8.  DO NOT shower/wash with your normal soap after using and rinsing off  the CHG Soap.                9.  Pat yourself dry with a clean towel.            10.  Wear clean pajamas.            11.  Place clean sheets on your bed the night of your first shower and do not  sleep with pets. Day of Surgery : Do not apply any lotions/deodorants the morning of surgery.  Please wear clean clothes to the hospital/surgery center.  FAILURE TO FOLLOW THESE INSTRUCTIONS MAY RESULT IN THE CANCELLATION OF YOUR SURGERY PATIENT SIGNATURE_________________________________  NURSE SIGNATURE__________________________________  ________________________________________________________________________  WHAT IS A BLOOD TRANSFUSION? Blood Transfusion Information  A transfusion is the replacement of blood or some of its parts. Blood is made up of multiple cells which provide different functions.  Red blood cells  carry oxygen and are used for blood loss replacement.  White blood cells fight against infection.  Platelets control bleeding.  Plasma helps clot blood.  Other blood products are available for specialized needs, such as hemophilia or other clotting disorders. BEFORE THE TRANSFUSION  Who gives blood for transfusions?   Healthy volunteers who are fully evaluated to make sure their blood is safe. This is blood bank blood. Transfusion therapy is the safest it has ever been in the practice of medicine. Before blood is taken from a donor, a complete history is taken to make sure that person has no history of diseases nor engages in risky social behavior (examples are intravenous drug use or sexual activity with multiple partners). The donor's travel history is screened to minimize risk of transmitting infections, such as malaria. The donated blood is tested for signs of infectious diseases, such as HIV and hepatitis. The blood is then tested to be sure it is compatible with you in order to minimize the chance of a transfusion reaction. If you or a relative donates blood, this is often done in anticipation of surgery and is not appropriate for emergency situations. It takes many days to process the donated blood. RISKS AND COMPLICATIONS Although transfusion therapy is very safe and saves many lives, the main dangers of transfusion include:  1. Getting an infectious disease. 2. Developing a transfusion reaction. This is an allergic reaction to something in the blood you were given. Every precaution is taken to prevent this. The decision to have a blood transfusion has been considered carefully by your caregiver before blood is given. Blood is not given unless the benefits outweigh the risks. AFTER  THE TRANSFUSION  Right after receiving a blood transfusion, you will usually feel much better and more energetic. This is especially true if your red blood cells have gotten low (anemic). The transfusion  raises the level of the red blood cells which carry oxygen, and this usually causes an energy increase.  The nurse administering the transfusion will monitor you carefully for complications. HOME CARE INSTRUCTIONS  No special instructions are needed after a transfusion. You may find your energy is better. Speak with your caregiver about any limitations on activity for underlying diseases you may have. SEEK MEDICAL CARE IF:   Your condition is not improving after your transfusion.  You develop redness or irritation at the intravenous (IV) site. SEEK IMMEDIATE MEDICAL CARE IF:  Any of the following symptoms occur over the next 12 hours:  Shaking chills.  You have a temperature by mouth above 102 F (38.9 C), not controlled by medicine.  Chest, back, or muscle pain.  People around you feel you are not acting correctly or are confused.  Shortness of breath or difficulty breathing.  Dizziness and fainting.  You get a rash or develop hives.  You have a decrease in urine output.  Your urine turns a dark color or changes to pink, red, or brown. Any of the following symptoms occur over the next 10 days:  You have a temperature by mouth above 102 F (38.9 C), not controlled by medicine.  Shortness of breath.  Weakness after normal activity.  The white part of the eye turns yellow (jaundice).  You have a decrease in the amount of urine or are urinating less often.  Your urine turns a dark color or changes to pink, red, or brown. Document Released: 04/16/2000 Document Revised: 07/12/2011 Document Reviewed: 12/04/2007 ExitCare Patient Information 2014 [place].  _______________________________________________________________________  Incentive Spirometer  An incentive spirometer is a tool that can help keep your lungs clear and active. This tool measures how well you are filling your lungs with each breath. Taking long deep breaths may help reverse or decrease the chance  of developing breathing (pulmonary) problems (especially infection) following:  A long period of time when you are unable to move or be active. BEFORE THE PROCEDURE   If the spirometer includes an indicator to show your best effort, your nurse or respiratory therapist will set it to a desired goal.  If possible, sit up straight or lean slightly forward. Try not to slouch.  Hold the incentive spirometer in an upright position. INSTRUCTIONS FOR USE  3. Sit on the edge of your bed if possible, or sit up as far as you can in bed or on a chair. 4. Hold the incentive spirometer in an upright position. 5. Breathe out normally. 6. Place the mouthpiece in your mouth and seal your lips tightly around it. 7. Breathe in slowly and as deeply as possible, raising the piston or the ball toward the top of the column. 8. Hold your breath for 3-5 seconds or for as long as possible. Allow the piston or ball to fall to the bottom of the column. 9. Remove the mouthpiece from your mouth and breathe out normally. 10. Rest for a few seconds and repeat Steps 1 through 7 at least 10 times every 1-2 hours when you are awake. Take your time and take a few normal breaths between deep breaths. 11. The spirometer may include an indicator to show your best effort. Use the indicator as a goal to work toward during each  repetition. 12. After each set of 10 deep breaths, practice coughing to be sure your lungs are clear. If you have an incision (the cut made at the time of surgery), support your incision when coughing by placing a pillow or rolled up towels firmly against it. Once you are able to get out of bed, walk around indoors and cough well. You may stop using the incentive spirometer when instructed by your caregiver.  RISKS AND COMPLICATIONS  Take your time so you do not get dizzy or light-headed.  If you are in pain, you may need to take or ask for pain medication before doing incentive spirometry. It is harder to  take a deep breath if you are having pain. AFTER USE  Rest and breathe slowly and easily.  It can be helpful to keep track of a log of your progress. Your caregiver can provide you with a simple table to help with this. If you are using the spirometer at home, follow these instructions: [place] IF:   You are having difficultly using the spirometer.  You have trouble using the spirometer as often as instructed.  Your pain medication is not giving enough relief while using the spirometer.  You develop fever of 100.5 F (38.1 C) or higher. SEEK IMMEDIATE MEDICAL CARE IF:   You cough up bloody sputum that had not been present before.  You develop fever of 102 F (38.9 C) or greater.  You develop worsening pain at or near the incision site. MAKE SURE YOU:   Understand these instructions.  Will watch your condition.  Will get help right away if you are not doing well or get worse. Document Released: 08/30/2006 Document Revised: 07/12/2011 Document Reviewed: 10/31/2006 [facility] Patient Information 2014 [address] [place], [place].   ________________________________________________________________________

## 2015-03-12 ENCOUNTER — Encounter (HOSPITAL_COMMUNITY): Payer: Self-pay

## 2015-03-12 ENCOUNTER — Encounter (HOSPITAL_COMMUNITY)
Admission: RE | Admit: 2015-03-12 | Discharge: 2015-03-12 | Disposition: A | Discharge status: Home / Self Care | Payer: 59 | Source: Ambulatory Visit | Attending: Orthopedic Surgery | Admitting: Orthopedic Surgery

## 2015-03-12 DIAGNOSIS — M1611 Unilateral primary osteoarthritis, right hip: Secondary | ICD-10-CM | POA: Diagnosis not present

## 2015-03-12 DIAGNOSIS — Z01818 Encounter for other preprocedural examination: Secondary | ICD-10-CM | POA: Diagnosis not present

## 2015-03-12 HISTORY — DX: Anxiety disorder, unspecified: F41.9

## 2015-03-12 HISTORY — DX: Other specified postprocedural states: Z98.890

## 2015-03-12 HISTORY — DX: Other specified postprocedural states: R11.2

## 2015-03-12 LAB — COMPREHENSIVE METABOLIC PANEL
ALBUMIN: 4.1 g/dL (ref 3.5–5.0)
ALT: 17 U/L (ref 14–54)
AST: 23 U/L (ref 15–41)
Alkaline Phosphatase: 106 U/L (ref 38–126)
Anion gap: 9 (ref 5–15)
BILIRUBIN TOTAL: 0.8 mg/dL (ref 0.3–1.2)
BUN: 21 mg/dL — AB (ref 6–20)
CHLORIDE: 105 mmol/L (ref 101–111)
CO2: 26 mmol/L (ref 22–32)
CREATININE: 0.56 mg/dL (ref 0.44–1.00)
Calcium: 9.2 mg/dL (ref 8.9–10.3)
GFR calc Af Amer: >60 mL/min (ref >60)
GLUCOSE: 82 mg/dL (ref 65–99)
Potassium: 3.6 mmol/L (ref 3.5–5.1)
Sodium: 140 mmol/L (ref 135–145)
Total Protein: 7.1 g/dL (ref 6.5–8.1)

## 2015-03-12 LAB — CBC
HEMATOCRIT: 38 % (ref 36.0–46.0)
Hemoglobin: 12.3 g/dL (ref 12.0–15.0)
MCH: 28.9 pg (ref 26.0–34.0)
MCHC: 32.4 g/dL (ref 30.0–36.0)
MCV: 89.2 fL (ref 78.0–100.0)
PLATELETS: 313 10*3/uL (ref 150–400)
RBC: 4.26 MIL/uL (ref 3.87–5.11)
RDW: 14.2 % (ref 11.5–15.5)
WBC: 11 10*3/uL — AB (ref 4.0–10.5)

## 2015-03-12 LAB — URINE MICROSCOPIC-ADD ON

## 2015-03-12 LAB — URINALYSIS, ROUTINE W REFLEX MICROSCOPIC
BILIRUBIN URINE: NEGATIVE
Glucose, UA: NEGATIVE mg/dL
KETONES UR: NEGATIVE mg/dL
Leukocytes, UA: NEGATIVE
NITRITE: NEGATIVE
PROTEIN: NEGATIVE mg/dL
SPECIFIC GRAVITY, URINE: 1.03 (ref 1.005–1.030)
UROBILINOGEN UA: 1 mg/dL (ref 0.0–1.0)
pH: 6 (ref 5.0–8.0)

## 2015-03-12 LAB — APTT: aPTT: 40 seconds — ABNORMAL HIGH (ref 24–37)

## 2015-03-12 LAB — PROTIME-INR
INR: 1.06 (ref 0.00–1.49)
PROTHROMBIN TIME: 14 s (ref 11.6–15.2)

## 2015-03-12 LAB — SURGICAL PCR SCREEN
MRSA, PCR: NEGATIVE
STAPHYLOCOCCUS AUREUS: NEGATIVE

## 2015-03-12 NOTE — Progress Notes (Addendum)
PTT, U/A and micro, and CMP  results done 03/12/2015 faxed via EPIC to Dr Lequita HaltAluisio.

## 2015-03-12 NOTE — Progress Notes (Signed)
Dr  Wynelle LinkSun - clearance on chart

## 2015-03-14 NOTE — H&P (Signed)
TOTAL HIP ADMISSION H&P  Patient is admitted for right total hip arthroplasty.  Subjective:  Chief Complaint: right hip pain  HPI: Tammy [name], 64 y.o. female, has a history of pain and functional disability in the right hip(s) due to arthritis and patient has failed non-surgical conservative treatments for greater than 12 weeks to include NSAID's and/or analgesics, corticosteriod injections, use of assistive devices and activity modification.  Onset of symptoms was gradual starting 8 years ago with gradually worsening course since that time.The patient noted no past surgery on the right hip(s).  Patient currently rates pain in the right hip at 8 out of 10 with activity. Patient has night pain, worsening of pain with activity and weight bearing, pain that interfers with activities of daily living and pain with passive range of motion. Patient has evidence of periarticular osteophytes, joint space narrowing and protrusio by imaging studies. This condition presents safety issues increasing the risk of falls. There is no current active infection.  Patient Active Problem List   Diagnosis Date Noted  . OA (osteoarthritis) of hip 01/16/2014   Past Medical History  Diagnosis Date  . Hypertension   . Hyperlipidemia   . Heart murmur     hx of   . Depression   . Arthritis   . PONV (postoperative nausea and vomiting)   . Anxiety     Past Surgical History  Procedure Laterality Date  . Total hip arthroplasty Left 01/16/2014    Procedure: LEFT TOTAL HIP ARTHROPLASTY ANTERIOR APPROACH;  Surgeon: Loanne DrillingFrank Aluisio V, MD;  Location: [facility];  Service: Orthopedics;  Laterality: Left;     Current outpatient prescriptions:  .  acetaminophen (TYLENOL) 500 MG tablet, Take 500 mg by mouth every 6 (six) hours as needed., Disp: , Rfl:  .  aspirin EC 81 MG tablet, Take 81 mg by mouth daily. Takes in the pm, Disp: , Rfl:  .  atorvastatin (LIPITOR) 40 MG tablet, Take 40 mg by mouth daily. Takes in the pm, Disp:  , Rfl:  .  diphenhydrAMINE (BENADRYL) 25 MG tablet, Take 25 mg by mouth every 6 (six) hours as needed for allergies (for headaches)., Disp: , Rfl:  .  escitalopram (LEXAPRO) 10 MG tablet, Take 10 mg by mouth every evening., Disp: , Rfl:  .  felodipine (PLENDIL) 10 MG 24 hr tablet, Take 10 mg by mouth daily. Takes in the evening, Disp: , Rfl:  .  lisinopril-hydrochlorothiazide (PRINZIDE,ZESTORETIC) 20-12.5 MG per tablet, Take 1 tablet by mouth every evening., Disp: , Rfl:  .  meloxicam (MOBIC) 15 MG tablet, Take 15 mg by mouth daily. Takes as needed, Disp: , Rfl:  .  metoprolol succinate (TOPROL-XL) 100 MG 24 hr tablet, Take 100 mg by mouth daily. Take with or immediately following a meal.  Takes in the pm, Disp: , Rfl:   No Known Allergies  Social History  Substance Use Topics  . Smoking status: Never Smoker   . Smokeless tobacco: Never Used  . Alcohol Use: No     Review of Systems  Constitutional: Negative.   HENT: Negative.   Respiratory: Positive for cough.   Gastrointestinal: Positive for heartburn. Negative for nausea, vomiting, abdominal pain, diarrhea, constipation, blood in stool and melena.  Genitourinary: Negative.   Musculoskeletal: Positive for myalgias and joint pain. Negative for back pain, falls and neck pain.       Right hip pain  Skin: Negative.   Neurological: Negative.   Endo/Heme/Allergies: Negative.   Psychiatric/Behavioral: Negative.  Objective:  Physical Exam  Constitutional: She is oriented to person, place, and time. She appears well-developed. No distress.  Obese  HENT:  Head: Normocephalic and atraumatic.  Right Ear: External ear normal.  Left Ear: External ear normal.  Nose: Nose normal.  Mouth/Throat: Oropharynx is clear and moist.  Eyes: Conjunctivae and EOM are normal.  Neck: Normal range of motion. Neck supple.  Cardiovascular: Normal rate, regular rhythm, normal heart sounds and intact distal pulses.   No murmur heard. Respiratory:  Effort normal and breath sounds normal. No respiratory distress. She has no wheezes.  GI: Soft. Bowel sounds are normal. She exhibits no distension. There is no tenderness.  Musculoskeletal:       Right hip: She exhibits decreased range of motion.       Left hip: Normal.       Right knee: Normal.       Left knee: Normal.  Left hip flexed to 110, rotate in 20, out 30, abduct 30 without discomfort. Right flexed to 90, no internal or external rotation, abduction 20.  Neurological: She is alert and oriented to person, place, and time. She has normal strength and normal reflexes. No sensory deficit.  Skin: No rash noted. She is not diaphoretic. No erythema.  Psychiatric: She has a normal mood and affect. Her behavior is normal.    Vitals  Weight: 205 lb Height: 65in Body Surface Area: 2 m Body Mass Index: 34.11 kg/m  Pulse: 76 (Regular)  BP: 148/90 (Sitting, Left Arm, Standard)  Imaging Review Plain radiographs demonstrate severe degenerative joint disease of the right hip(s). The bone quality appears to be good for age and reported activity level.  Assessment/Plan:  End stage primary osteoarthritis, right hip(s)  The patient history, physical examination, clinical judgement of the provider and imaging studies are consistent with end stage degenerative joint disease of the right hip(s) and total hip arthroplasty is deemed medically necessary. The treatment options including medical management, injection therapy, arthroscopy and arthroplasty were discussed at length. The risks and benefits of total hip arthroplasty were presented and reviewed. The risks due to aseptic loosening, infection, stiffness, dislocation/subluxation,  thromboembolic complications and other imponderables were discussed.  The patient acknowledged the explanation, agreed to proceed with the plan and consent was signed. Patient is being admitted for inpatient treatment for surgery, pain control, PT, OT,  prophylactic antibiotics, VTE prophylaxis, progressive ambulation and ADL's and discharge planning.The patient is planning to be discharged home.   Prefers no HHPT. Feels comfortable on her own PCP: Dr. Evert Kohl     Dimitri Ped, PA-C

## 2015-03-17 NOTE — Progress Notes (Signed)
Final EKG done 03/12/2015 in EPIC.

## 2015-03-18 ENCOUNTER — Encounter (HOSPITAL_COMMUNITY): Payer: Self-pay | Admitting: Anesthesiology

## 2015-03-18 NOTE — Anesthesia Preprocedure Evaluation (Addendum)
Anesthesia Evaluation  Patient identified by MRN, date of birth, ID band Patient awake    Reviewed: Allergy & Precautions, NPO status , Patient's Chart, lab work & pertinent test results  History of Anesthesia Complications (+) PONV and history of anesthetic complications  Airway Mallampati: II  TM Distance: >3 FB Neck ROM: Full    Dental no notable dental hx.    Pulmonary neg pulmonary ROS,    Pulmonary exam normal breath sounds clear to auscultation       Cardiovascular hypertension, Pt. on medications and Pt. on home beta blockers Normal cardiovascular exam+ Valvular Problems/Murmurs  Rhythm:Regular Rate:Normal     Neuro/Psych PSYCHIATRIC DISORDERS Anxiety Depression negative neurological ROS     GI/Hepatic negative GI ROS, Neg liver ROS,   Endo/Other  negative endocrine ROS  Renal/GU negative Renal ROS  negative genitourinary   Musculoskeletal  (+) Arthritis ,   Abdominal (+) + obese,   Peds negative pediatric ROS (+)  Hematology negative hematology ROS (+)   Anesthesia Other Findings   Reproductive/Obstetrics negative OB ROS                           Anesthesia Physical Anesthesia Plan  ASA: II  Anesthesia Plan: General   Post-op Pain Management:    Induction: Intravenous  Airway Management Planned: Oral ETT  Additional Equipment:   Intra-op Plan:   Post-operative Plan: Extubation in OR  Informed Consent: I have reviewed the patients History and Physical, chart, labs and discussed the procedure including the risks, benefits and alternatives for the proposed anesthesia with the patient or authorized representative who has indicated his/her understanding and acceptance.   Dental advisory given  Plan Discussed with: CRNA  Anesthesia Plan Comments: (Discussed spinal and general. She had general last time and was satisfied. She prefers general.)       Anesthesia  Quick Evaluation

## 2015-03-19 ENCOUNTER — Inpatient Hospital Stay (HOSPITAL_COMMUNITY): Payer: 59

## 2015-03-19 ENCOUNTER — Encounter (HOSPITAL_COMMUNITY): Payer: Self-pay | Admitting: Certified Registered Nurse Anesthetist

## 2015-03-19 ENCOUNTER — Inpatient Hospital Stay (HOSPITAL_COMMUNITY): Payer: 59 | Admitting: Anesthesiology

## 2015-03-19 ENCOUNTER — Encounter (HOSPITAL_COMMUNITY)
Admission: RE | Disposition: A | Discharge status: Home Health | Payer: Self-pay | Source: Ambulatory Visit | Attending: Orthopedic Surgery

## 2015-03-19 ENCOUNTER — Inpatient Hospital Stay (HOSPITAL_COMMUNITY)
Admission: RE | Admit: 2015-03-19 | Discharge: 2015-03-20 | DRG: 470 | Disposition: A | Discharge status: Home Health | Payer: 59 | Source: Ambulatory Visit | Attending: Orthopedic Surgery | Admitting: Orthopedic Surgery

## 2015-03-19 DIAGNOSIS — Z7982 Long term (current) use of aspirin: Secondary | ICD-10-CM

## 2015-03-19 DIAGNOSIS — E669 Obesity, unspecified: Secondary | ICD-10-CM | POA: Diagnosis present

## 2015-03-19 DIAGNOSIS — Z79899 Other long term (current) drug therapy: Secondary | ICD-10-CM

## 2015-03-19 DIAGNOSIS — M169 Osteoarthritis of hip, unspecified: Secondary | ICD-10-CM | POA: Diagnosis present

## 2015-03-19 DIAGNOSIS — Z01812 Encounter for preprocedural laboratory examination: Secondary | ICD-10-CM | POA: Diagnosis not present

## 2015-03-19 DIAGNOSIS — M1611 Unilateral primary osteoarthritis, right hip: Principal | ICD-10-CM | POA: Diagnosis present

## 2015-03-19 DIAGNOSIS — Z6838 Body mass index (BMI) 38.0-38.9, adult: Secondary | ICD-10-CM | POA: Diagnosis not present

## 2015-03-19 DIAGNOSIS — Z96642 Presence of left artificial hip joint: Secondary | ICD-10-CM | POA: Diagnosis present

## 2015-03-19 DIAGNOSIS — I10 Essential (primary) hypertension: Secondary | ICD-10-CM | POA: Diagnosis present

## 2015-03-19 DIAGNOSIS — E785 Hyperlipidemia, unspecified: Secondary | ICD-10-CM | POA: Diagnosis present

## 2015-03-19 DIAGNOSIS — Z96649 Presence of unspecified artificial hip joint: Secondary | ICD-10-CM

## 2015-03-19 DIAGNOSIS — M25551 Pain in right hip: Secondary | ICD-10-CM | POA: Diagnosis present

## 2015-03-19 HISTORY — PX: TOTAL HIP ARTHROPLASTY: SHX124

## 2015-03-19 LAB — TYPE AND SCREEN
ABO/RH(D): O POS
Antibody Screen: NEGATIVE

## 2015-03-19 SURGERY — ARTHROPLASTY, HIP, TOTAL, ANTERIOR APPROACH
Anesthesia: General | Site: Hip | Laterality: Right

## 2015-03-19 MED ORDER — DEXAMETHASONE SODIUM PHOSPHATE 10 MG/ML IJ SOLN
10.0000 mg | Freq: Once | INTRAMUSCULAR | Status: AC
Start: 2015-03-19 — End: 2015-03-19
  Administered 2015-03-19: 10 mg via INTRAVENOUS

## 2015-03-19 MED ORDER — CEFAZOLIN SODIUM-DEXTROSE 2-3 GM-% IV SOLR
2.0000 g | Freq: Four times a day (QID) | INTRAVENOUS | Status: AC
Start: 2015-03-19 — End: 2015-03-19
  Administered 2015-03-19 (×2): 2 g via INTRAVENOUS
  Filled 2015-03-19 (×2): qty 50

## 2015-03-19 MED ORDER — ACETAMINOPHEN 650 MG RE SUPP: 650.0000 mg | Freq: Four times a day (QID) | RECTAL | Status: DC | PRN

## 2015-03-19 MED ORDER — EPHEDRINE SULFATE 50 MG/ML IJ SOLN
INTRAMUSCULAR | Status: AC
  Filled 2015-03-19: qty 2

## 2015-03-19 MED ORDER — NEOSTIGMINE METHYLSULFATE 10 MG/10ML IV SOLN
INTRAVENOUS | Status: DC | PRN
  Administered 2015-03-19: 5 mg via INTRAVENOUS

## 2015-03-19 MED ORDER — PROPOFOL 10 MG/ML IV BOLUS
INTRAVENOUS | Status: AC
  Filled 2015-03-19: qty 40

## 2015-03-19 MED ORDER — EPHEDRINE SULFATE 50 MG/ML IJ SOLN
INTRAMUSCULAR | Status: DC | PRN
  Administered 2015-03-19 (×2): 10 mg via INTRAVENOUS

## 2015-03-19 MED ORDER — KETOROLAC TROMETHAMINE 15 MG/ML IJ SOLN: 7.5000 mg | Freq: Four times a day (QID) | INTRAMUSCULAR | Status: AC | PRN

## 2015-03-19 MED ORDER — HYDROMORPHONE HCL 1 MG/ML IJ SOLN
0.2500 mg | INTRAMUSCULAR | Status: DC | PRN
  Administered 2015-03-19 (×4): 0.5 mg via INTRAVENOUS

## 2015-03-19 MED ORDER — HYDROMORPHONE HCL 1 MG/ML IJ SOLN
INTRAMUSCULAR | Status: AC
  Filled 2015-03-19: qty 1

## 2015-03-19 MED ORDER — MORPHINE SULFATE (PF) 2 MG/ML IV SOLN: 1.0000 mg | INTRAVENOUS | Status: DC | PRN

## 2015-03-19 MED ORDER — LACTATED RINGERS IV SOLN
INTRAVENOUS | Status: DC | PRN
  Administered 2015-03-19 (×3): via INTRAVENOUS

## 2015-03-19 MED ORDER — KCL IN DEXTROSE-NACL 20-5-0.9 MEQ/L-%-% IV SOLN
INTRAVENOUS | Status: DC
  Administered 2015-03-19: 16:00:00 via INTRAVENOUS
  Filled 2015-03-19 (×3): qty 1000

## 2015-03-19 MED ORDER — FLEET ENEMA 7-19 GM/118ML RE ENEM: 1.0000 | ENEMA | Freq: Once | RECTAL | Status: DC | PRN

## 2015-03-19 MED ORDER — 0.9 % SODIUM CHLORIDE (POUR BTL) OPTIME
TOPICAL | Status: DC | PRN
  Administered 2015-03-19: 1000 mL

## 2015-03-19 MED ORDER — ROCURONIUM BROMIDE 100 MG/10ML IV SOLN
INTRAVENOUS | Status: DC | PRN
  Administered 2015-03-19: 40 mg via INTRAVENOUS

## 2015-03-19 MED ORDER — CHLORHEXIDINE GLUCONATE 4 % EX LIQD: 60.0000 mL | Freq: Once | CUTANEOUS | Status: DC

## 2015-03-19 MED ORDER — METHOCARBAMOL 1000 MG/10ML IJ SOLN
500.0000 mg | Freq: Four times a day (QID) | INTRAMUSCULAR | Status: DC | PRN
  Administered 2015-03-19: 500 mg via INTRAVENOUS
  Filled 2015-03-19 (×2): qty 5

## 2015-03-19 MED ORDER — LIDOCAINE HCL (CARDIAC) 20 MG/ML IV SOLN
INTRAVENOUS | Status: DC | PRN
  Administered 2015-03-19: 50 mg via INTRAVENOUS

## 2015-03-19 MED ORDER — MENTHOL 3 MG MT LOZG
1.0000 | LOZENGE | OROMUCOSAL | Status: DC | PRN
  Filled 2015-03-19: qty 9

## 2015-03-19 MED ORDER — SUCCINYLCHOLINE CHLORIDE 20 MG/ML IJ SOLN
INTRAMUSCULAR | Status: DC | PRN
  Administered 2015-03-19: 100 mg via INTRAVENOUS

## 2015-03-19 MED ORDER — FENTANYL CITRATE (PF) 100 MCG/2ML IJ SOLN
INTRAMUSCULAR | Status: AC
  Filled 2015-03-19: qty 2

## 2015-03-19 MED ORDER — ACETAMINOPHEN 500 MG PO TABS
1000.0000 mg | ORAL_TABLET | Freq: Four times a day (QID) | ORAL | Status: AC
  Administered 2015-03-19 – 2015-03-20 (×4): 1000 mg via ORAL
  Filled 2015-03-19 (×4): qty 2

## 2015-03-19 MED ORDER — ACETAMINOPHEN 10 MG/ML IV SOLN
1000.0000 mg | Freq: Once | INTRAVENOUS | Status: AC
  Administered 2015-03-19: 1000 mg via INTRAVENOUS
  Filled 2015-03-19: qty 100

## 2015-03-19 MED ORDER — ESCITALOPRAM OXALATE 10 MG PO TABS
10.0000 mg | ORAL_TABLET | Freq: Every evening | ORAL | Status: DC
  Administered 2015-03-19: 10 mg via ORAL
  Filled 2015-03-19 (×2): qty 1

## 2015-03-19 MED ORDER — OXYCODONE HCL 5 MG PO TABS
5.0000 mg | ORAL_TABLET | ORAL | Status: DC | PRN
  Administered 2015-03-19 (×2): 5 mg via ORAL
  Administered 2015-03-20 (×3): 10 mg via ORAL
  Filled 2015-03-19: qty 2
  Filled 2015-03-19 (×2): qty 1
  Filled 2015-03-19 (×2): qty 2

## 2015-03-19 MED ORDER — DEXAMETHASONE SODIUM PHOSPHATE 10 MG/ML IJ SOLN
10.0000 mg | Freq: Once | INTRAMUSCULAR | Status: AC
Start: 2015-03-20 — End: 2015-03-20
  Administered 2015-03-20: 10 mg via INTRAVENOUS
  Filled 2015-03-19: qty 1

## 2015-03-19 MED ORDER — ONDANSETRON HCL 4 MG/2ML IJ SOLN
INTRAMUSCULAR | Status: DC | PRN
  Administered 2015-03-19: 4 mg via INTRAVENOUS

## 2015-03-19 MED ORDER — BUPIVACAINE HCL (PF) 0.25 % IJ SOLN
INTRAMUSCULAR | Status: AC
  Filled 2015-03-19: qty 30

## 2015-03-19 MED ORDER — DOCUSATE SODIUM 100 MG PO CAPS
100.0000 mg | ORAL_CAPSULE | Freq: Two times a day (BID) | ORAL | Status: DC
  Administered 2015-03-19 – 2015-03-20 (×2): 100 mg via ORAL

## 2015-03-19 MED ORDER — PROPOFOL 10 MG/ML IV BOLUS
INTRAVENOUS | Status: AC
  Filled 2015-03-19: qty 20

## 2015-03-19 MED ORDER — MIDAZOLAM HCL 2 MG/2ML IJ SOLN
INTRAMUSCULAR | Status: AC
  Filled 2015-03-19: qty 2

## 2015-03-19 MED ORDER — SODIUM CHLORIDE 0.9 % IV SOLN: INTRAVENOUS | Status: DC

## 2015-03-19 MED ORDER — BUPIVACAINE HCL (PF) 0.25 % IJ SOLN
INTRAMUSCULAR | Status: DC | PRN
  Administered 2015-03-19: 30 mL

## 2015-03-19 MED ORDER — PROPOFOL 500 MG/50ML IV EMUL
INTRAVENOUS | Status: DC | PRN
  Administered 2015-03-19: 200 mg via INTRAVENOUS

## 2015-03-19 MED ORDER — PROMETHAZINE HCL 25 MG/ML IJ SOLN: 6.2500 mg | INTRAMUSCULAR | Status: DC | PRN

## 2015-03-19 MED ORDER — POLYETHYLENE GLYCOL 3350 17 G PO PACK: 17.0000 g | PACK | Freq: Every day | ORAL | Status: DC | PRN

## 2015-03-19 MED ORDER — METOPROLOL SUCCINATE ER 100 MG PO TB24
100.0000 mg | ORAL_TABLET | Freq: Every day | ORAL | Status: DC
  Administered 2015-03-20: 100 mg via ORAL
  Filled 2015-03-19: qty 1

## 2015-03-19 MED ORDER — MIDAZOLAM HCL 5 MG/5ML IJ SOLN
INTRAMUSCULAR | Status: DC | PRN
  Administered 2015-03-19: 2 mg via INTRAVENOUS

## 2015-03-19 MED ORDER — METOCLOPRAMIDE HCL 10 MG PO TABS: 5.0000 mg | ORAL_TABLET | Freq: Three times a day (TID) | ORAL | Status: DC | PRN

## 2015-03-19 MED ORDER — RIVAROXABAN 10 MG PO TABS
10.0000 mg | ORAL_TABLET | Freq: Every day | ORAL | Status: DC
Start: 2015-03-20 — End: 2015-03-20
  Administered 2015-03-20: 10 mg via ORAL
  Filled 2015-03-19 (×2): qty 1

## 2015-03-19 MED ORDER — METOCLOPRAMIDE HCL 5 MG/ML IJ SOLN: 5.0000 mg | Freq: Three times a day (TID) | INTRAMUSCULAR | Status: DC | PRN

## 2015-03-19 MED ORDER — CEFAZOLIN SODIUM-DEXTROSE 2-3 GM-% IV SOLR
2.0000 g | INTRAVENOUS | Status: AC
  Administered 2015-03-19: 2 g via INTRAVENOUS

## 2015-03-19 MED ORDER — TRANEXAMIC ACID 1000 MG/10ML IV SOLN
1000.0000 mg | INTRAVENOUS | Status: AC
  Administered 2015-03-19: 1000 mg via INTRAVENOUS
  Filled 2015-03-19: qty 10

## 2015-03-19 MED ORDER — TRAMADOL HCL 50 MG PO TABS: 50.0000 mg | ORAL_TABLET | Freq: Four times a day (QID) | ORAL | Status: DC | PRN

## 2015-03-19 MED ORDER — ACETAMINOPHEN 325 MG PO TABS: 650.0000 mg | ORAL_TABLET | Freq: Four times a day (QID) | ORAL | Status: DC | PRN

## 2015-03-19 MED ORDER — PHENOL 1.4 % MT LIQD: 1.0000 | OROMUCOSAL | Status: DC | PRN

## 2015-03-19 MED ORDER — ONDANSETRON HCL 4 MG/2ML IJ SOLN
4.0000 mg | Freq: Four times a day (QID) | INTRAMUSCULAR | Status: DC | PRN
  Administered 2015-03-19: 4 mg via INTRAVENOUS
  Filled 2015-03-19: qty 2

## 2015-03-19 MED ORDER — ATORVASTATIN CALCIUM 40 MG PO TABS
40.0000 mg | ORAL_TABLET | Freq: Every day | ORAL | Status: DC
Start: 2015-03-19 — End: 2015-03-20
  Administered 2015-03-19: 40 mg via ORAL
  Filled 2015-03-19 (×2): qty 1

## 2015-03-19 MED ORDER — GLYCOPYRROLATE 0.2 MG/ML IJ SOLN
INTRAMUSCULAR | Status: DC | PRN
  Administered 2015-03-19: .8 mg via INTRAVENOUS
  Administered 2015-03-19: 0.2 mg via INTRAVENOUS

## 2015-03-19 MED ORDER — FELODIPINE ER 10 MG PO TB24
10.0000 mg | ORAL_TABLET | Freq: Every day | ORAL | Status: DC
  Administered 2015-03-19: 10 mg via ORAL
  Filled 2015-03-19 (×2): qty 1

## 2015-03-19 MED ORDER — CEFAZOLIN SODIUM-DEXTROSE 2-3 GM-% IV SOLR
INTRAVENOUS | Status: AC
  Filled 2015-03-19: qty 50

## 2015-03-19 MED ORDER — BISACODYL 10 MG RE SUPP: 10.0000 mg | Freq: Every day | RECTAL | Status: DC | PRN

## 2015-03-19 MED ORDER — SODIUM CHLORIDE 0.9 % IJ SOLN
INTRAMUSCULAR | Status: AC
  Filled 2015-03-19: qty 20

## 2015-03-19 MED ORDER — DIPHENHYDRAMINE HCL 12.5 MG/5ML PO ELIX: 12.5000 mg | ORAL_SOLUTION | ORAL | Status: DC | PRN

## 2015-03-19 MED ORDER — METHOCARBAMOL 500 MG PO TABS: 500.0000 mg | ORAL_TABLET | Freq: Four times a day (QID) | ORAL | Status: DC | PRN

## 2015-03-19 MED ORDER — FENTANYL CITRATE (PF) 100 MCG/2ML IJ SOLN
INTRAMUSCULAR | Status: DC | PRN
Start: 2015-03-19 — End: 2015-03-19
  Administered 2015-03-19 (×2): 100 ug via INTRAVENOUS
  Administered 2015-03-19 (×3): 50 ug via INTRAVENOUS

## 2015-03-19 MED ORDER — FENTANYL CITRATE (PF) 250 MCG/5ML IJ SOLN
INTRAMUSCULAR | Status: AC
  Filled 2015-03-19: qty 5

## 2015-03-19 MED ORDER — ONDANSETRON HCL 4 MG PO TABS: 4.0000 mg | ORAL_TABLET | Freq: Four times a day (QID) | ORAL | Status: DC | PRN

## 2015-03-19 MED ORDER — ACETAMINOPHEN 10 MG/ML IV SOLN
INTRAVENOUS | Status: AC
  Filled 2015-03-19: qty 100

## 2015-03-19 SURGICAL SUPPLY — 33 items
BAG DECANTER FOR FLEXI CONT (MISCELLANEOUS) ×2 IMPLANT
BAG SPEC THK2 15X12 ZIP CLS (MISCELLANEOUS)
BAG ZIPLOCK 12X15 (MISCELLANEOUS) IMPLANT
BLADE SAG 18X100X1.27 (BLADE) ×2 IMPLANT
CAPT HIP TOTAL 2 ×1 IMPLANT
CLOTH BEACON ORANGE TIMEOUT ST (SAFETY) ×2 IMPLANT
COVER PERINEAL POST (MISCELLANEOUS) ×2 IMPLANT
DECANTER SPIKE VIAL GLASS SM (MISCELLANEOUS) ×2 IMPLANT
DRAPE STERI IOBAN 125X83 (DRAPES) ×2 IMPLANT
DRAPE U-SHAPE 47X51 STRL (DRAPES) ×4 IMPLANT
DRSG ADAPTIC 3X8 NADH LF (GAUZE/BANDAGES/DRESSINGS) ×2 IMPLANT
DRSG MEPILEX BORDER 4X4 (GAUZE/BANDAGES/DRESSINGS) ×2 IMPLANT
DRSG MEPILEX BORDER 4X8 (GAUZE/BANDAGES/DRESSINGS) ×2 IMPLANT
DURAPREP 26ML APPLICATOR (WOUND CARE) ×2 IMPLANT
ELECT REM PT RETURN 9FT ADLT (ELECTROSURGICAL) ×2
ELECTRODE REM PT RTRN 9FT ADLT (ELECTROSURGICAL) ×1 IMPLANT
EVACUATOR 1/8 PVC DRAIN (DRAIN) ×2 IMPLANT
GLOVE BIO SURGEON STRL SZ7.5 (GLOVE) ×2 IMPLANT
GLOVE BIO SURGEON STRL SZ8 (GLOVE) ×4 IMPLANT
GLOVE BIOGEL PI IND STRL 8 (GLOVE) ×2 IMPLANT
GLOVE BIOGEL PI INDICATOR 8 (GLOVE) ×2
GOWN STRL REUS W/TWL LRG LVL3 (GOWN DISPOSABLE) ×2 IMPLANT
GOWN STRL REUS W/TWL XL LVL3 (GOWN DISPOSABLE) ×2 IMPLANT
PACK ANTERIOR HIP CUSTOM (KITS) ×2 IMPLANT
STRIP CLOSURE SKIN 1/2X4 (GAUZE/BANDAGES/DRESSINGS) ×2 IMPLANT
SUT ETHIBOND NAB CT1 #1 30IN (SUTURE) ×2 IMPLANT
SUT MNCRL AB 4-0 PS2 18 (SUTURE) ×2 IMPLANT
SUT VIC AB 2-0 CT1 27 (SUTURE) ×4
SUT VIC AB 2-0 CT1 TAPERPNT 27 (SUTURE) ×2 IMPLANT
SUT VLOC 180 0 24IN GS25 (SUTURE) ×2 IMPLANT
SYR 50ML LL SCALE MARK (SYRINGE) IMPLANT
TRAY FOLEY W/METER SILVER 14FR (SET/KITS/TRAYS/PACK) ×2 IMPLANT
YANKAUER SUCT BULB TIP 10FT TU (MISCELLANEOUS) ×2 IMPLANT

## 2015-03-19 NOTE — Op Note (Signed)
OPERATIVE REPORT  PREOPERATIVE DIAGNOSIS: Osteoarthritis of the Right hip.   POSTOPERATIVE DIAGNOSIS: Osteoarthritis of the Right  hip.   PROCEDURE: Right total hip arthroplasty, anterior approach.   SURGEON: Ollen Gross, MD   ASSISTANT: Avel Peace, PA-C  ANESTHESIA:  General  ESTIMATED BLOOD LOSS:-250 ml  DRAINS: Hemovac x1.   COMPLICATIONS: None   CONDITION: PACU - hemodynamically stable.   BRIEF CLINICAL NOTE: Tammy MERKEY is a 64 y.o. female who has advanced end-  stage arthritis of her Right  hip with progressively worsening pain and  dysfunction.The patient has failed nonoperative management and presents for  total hip arthroplasty.   PROCEDURE IN DETAIL: After successful administration of spinal  anesthetic, the traction boots for the [facility] bed were placed on both  feet and the patient was placed onto the [facility] bed, boots placed into the leg  holders. The Right hip was then isolated from the perineum with plastic  drapes and prepped and draped in the usual sterile fashion. ASIS and  greater trochanter were marked and a oblique incision was made, starting  at about 1 cm lateral and 2 cm distal to the ASIS and coursing towards  the anterior cortex of the femur. The skin was cut with a 10 blade  through subcutaneous tissue to the level of the fascia overlying the  tensor fascia lata muscle. The fascia was then incised in line with the  incision at the junction of the anterior third and posterior 2/3rd. The  muscle was teased off the fascia and then the interval between the TFL  and the rectus was developed. The Hohmann retractor was then placed at  the top of the femoral neck over the capsule. The vessels overlying the  capsule were cauterized and the fat on top of the capsule was removed.  A Hohmann retractor was then placed anterior underneath the rectus  femoris to give exposure to the entire anterior capsule. A T-shaped  capsulotomy was performed.  The edges were tagged and the femoral head  was identified.       Osteophytes are removed off the superior acetabulum.  The femoral neck was then cut in situ with an oscillating saw. Traction  was then applied to the left lower extremity utilizing the [facility]  traction. The femoral head was then removed. Retractors were placed  around the acetabulum and then circumferential removal of the labrum was  performed. Osteophytes were also removed. Reaming starts at 45 mm to  medialize and  Increased in 2 mm increments to 49 mm. We reamed in  approximately 40 degrees of abduction, 20 degrees anteversion. A 50 mm  pinnacle acetabular shell was then impacted in anatomic position under  fluoroscopic guidance with excellent purchase. We did not need to place  any additional dome screws. A 32 mm neutral + 4 marathon liner was then  placed into the acetabular shell.       The femoral lift was then placed along the lateral aspect of the femur  just distal to the vastus ridge. The leg was  externally rotated and capsule  was stripped off the inferior aspect of the femoral neck down to the  level of the lesser trochanter, this was done with electrocautery. The femur was lifted after this was performed. The  leg was then placed and extended in adducted position to essentially delivering the femur. We also removed the capsule superiorly and the  piriformis from the piriformis fossa  to gain excellent exposure of the  proximal femur. Rongeur was used to remove some cancellous bone to get  into the lateral portion of the proximal femur for placement of the  initial starter reamer. The starter broaches was placed  the starter broach  and was shown to go down the center of the canal. Broaching  with the  Corail system was then performed starting at size 8, coursing  Up to size 10. A size 10 had excellent torsional and rotational  and axial stability. The trial high offset neck was then placed  with a 32 + 1 trial  head. The hip was then reduced. We confirmed that  the stem was in the canal both on AP and lateral x-rays. It also has excellent sizing. The hip was reduced with outstanding stability through full extension, full external rotation,  and then flexion in adduction internal rotation. AP pelvis was taken  and the leg lengths were measured and found to be exactly equal. Hip  was then dislocated again and the femoral head and neck removed. The  femoral broach was removed. Size 10 Corail stem with a high offset  neck was then impacted into the femur following native anteversion. Has  excellent purchase in the canal. Excellent torsional and rotational and  axial stability. It is confirmed to be in the canal on AP and lateral  fluoroscopic views. The 32 + 1 ceramic head was placed and the hip  reduced with outstanding stability. Again AP pelvis was taken and it  confirmed that the leg lengths were equal. The wound was then copiously  irrigated with saline solution and the capsule reattached and repaired  with Ethibond suture. 30 ml of .25% Bupivicaine injected into the capsule and into the edge of the tensor fascia lata as well as subcutaneous tissue. The fascia overlying the tensor fascia lata was  then closed with a running #1 V-Loc. Subcu was closed with interrupted  2-0 Vicryl and subcuticular running 4-0 Monocryl. Incision was cleaned  and dried. Steri-Strips and a bulky sterile dressing applied. Hemovac  drain was hooked to suction and then he was awakened and transported to  recovery in stable condition.        Please note that a surgical assistant was a medical necessity for this procedure to perform it in a safe and expeditious manner. Assistant was necessary to provide appropriate retraction of vital neurovascular structures and to prevent femoral fracture and allow for anatomic placement of the prosthesis.  Ollen GrossFrank Redith Drach, M.D.

## 2015-03-19 NOTE — Transfer of Care (Signed)
Immediate Anesthesia Transfer of Care Note  Patient: Tammy [name]  Procedure(s) Performed: Procedure(s): TOTAL HIP ARTHROPLASTY ANTERIOR APPROACH (Right)  Patient Location: PACU  Anesthesia Type:General  Level of Consciousness: sedated, patient cooperative and responds to stimulation  Airway & Oxygen Therapy: Patient Spontanous Breathing and Patient connected to face mask oxygen  Post-op Assessment: Report given to RN and Post -op Vital signs reviewed and stable  Post vital signs: Reviewed and stable  Last Vitals:  Filed Vitals:   03/19/15 0631  BP: 149/75  Pulse: 59  Resp: 18    Complications: No apparent anesthesia complications

## 2015-03-19 NOTE — Anesthesia Postprocedure Evaluation (Signed)
  Anesthesia Post-op Note  Patient: Tammy [name]  Procedure(s) Performed: Procedure(s) (LRB): TOTAL HIP ARTHROPLASTY ANTERIOR APPROACH (Right)  Patient Location: PACU  Anesthesia Type: General  Level of Consciousness: awake and alert   Airway and Oxygen Therapy: Patient Spontanous Breathing  Post-op Pain: mild  Post-op Assessment: Post-op Vital signs reviewed, Patient's Cardiovascular Status Stable, Respiratory Function Stable, Patent Airway and No signs of Nausea or vomiting  Last Vitals:  Filed Vitals:   03/19/15 1300  BP: 128/65  Pulse: 57  Temp: 36.6 C  Resp: 16    Post-op Vital Signs: stable   Complications: No apparent anesthesia complications

## 2015-03-19 NOTE — Evaluation (Signed)
Physical Therapy Evaluation Patient Details Name: Tammy [name] MRN: [id number] DOB: [birth year] Today's Date: 03/19/2015   History of Present Illness  R DATHA  Clinical Impression  Patient c/o nausea, ambulated x 70' then began vomiting. Reports feeling better. Patient ambulated very well. Patient will benefit from PT to address problems listed in note below to return to  Level of independence to DC home    Follow Up Recommendations Home health PT;Supervision/Assistance - 24 hour    Equipment Recommendations  None recommended by PT    Recommendations for Other Services       Precautions / Restrictions Precautions Precautions: Fall      Mobility  Bed Mobility Overal bed mobility: Needs Assistance Bed Mobility: Supine to Sit     Supine to sit: Min assist     General bed mobility comments: cues for technique  Transfers Overall transfer level: Needs assistance Equipment used: Rolling walker (2 wheeled) Transfers: Sit to/from Stand Sit to Stand: Min assist         General transfer comment: cues for hand placement  Ambulation/Gait Ambulation/Gait assistance: Min assist Ambulation Distance (Feet): 70 Feet Assistive device: Rolling walker (2 wheeled) Gait Pattern/deviations: Step-to pattern;Step-through pattern     General Gait Details: cues for sequence  Stairs            Wheelchair Mobility    Modified Rankin (Stroke Patients Only)       Balance                                             Pertinent Vitals/Pain Pain Assessment: 0-10 Pain Score: 2  Pain Location: R hip  Pain Descriptors / Indicators: Discomfort;Sore Pain Intervention(s): Monitored during session;Ice applied;Repositioned    Home Living Family/patient expects to be discharged to:: Private residence Living Arrangements: Children Available Help at Discharge: Family;Available 24 hours/day Type of Home: Apartment Home Access: Stairs to enter Entrance  Stairs-Rails: Right;Left Entrance Stairs-Number of Steps: 24 with 2 landings Home Layout: One level Home Equipment: Walker - 2 wheels;Bedside commode      Prior Function Level of Independence: Independent               Hand Dominance        Extremity/Trunk Assessment   Upper Extremity Assessment: Overall WFL for tasks assessed           Lower Extremity Assessment: RLE deficits/detail RLE Deficits / Details: advances the leg     Cervical / Trunk Assessment: Normal  Communication   Communication: No difficulties  Cognition Arousal/Alertness: Awake/alert Behavior During Therapy: WFL for tasks assessed/performed Overall Cognitive Status: Within Functional Limits for tasks assessed                      General Comments      Exercises        Assessment/Plan    PT Assessment Patient needs continued PT services  PT Diagnosis Difficulty walking;Acute pain   PT Problem List Decreased strength;Decreased range of motion;Decreased activity tolerance;Decreased mobility;Pain;Decreased knowledge of precautions;Decreased safety awareness;Decreased knowledge of use of DME  PT Treatment Interventions DME instruction;Gait training;Stair training;Functional mobility training;Therapeutic activities;Therapeutic exercise;Patient/family education   PT Goals (Current goals can be found in the Care Plan section) Acute Rehab PT Goals Patient Stated Goal: to walk PT Goal Formulation: With patient Time For Goal Achievement: 03/22/15 Potential to Achieve Goals:  Good    Frequency 7X/week   Barriers to discharge        Co-evaluation               End of Session   Activity Tolerance: Patient tolerated treatment well Patient left: in chair;with call bell/phone within reach;with chair alarm set Nurse Communication: Mobility status (N/V)         Time: [id number] PT Time Calculation (min) (ACUTE ONLY): 17 min   Charges:   PT Evaluation $Initial PT Evaluation  Tier I: 1 Procedure     PT G CodesRada Hay:        Imoni Kohen Elizabeth 03/19/2015, 3:42 PM Blanchard KelchKaren Clete Kuch PT [phone]

## 2015-03-19 NOTE — Interval H&P Note (Signed)
History and Physical Interval Note:  03/19/2015 7:41 AM  Tammy [name]  has presented today for surgery, with the diagnosis of oa right hip  The various methods of treatment have been discussed with the patient and family. After consideration of risks, benefits and other options for treatment, the patient has consented to  Procedure(s): TOTAL HIP ARTHROPLASTY ANTERIOR APPROACH (Right) as a surgical intervention .  The patient's history has been reviewed, patient examined, no change in status, stable for surgery.  I have reviewed the patient's chart and labs.  Questions were answered to the patient's satisfaction.     Loanne DrillingALUISIO,Sheikh Leverich V

## 2015-03-19 NOTE — Anesthesia Procedure Notes (Signed)
Procedure Name: Intubation Performed by: Tamiyah Moulin J Pre-anesthesia Checklist: Patient identified, Emergency Drugs available, Suction available, Patient being monitored and Timeout performed Patient Re-evaluated:Patient Re-evaluated prior to inductionOxygen Delivery Method: Circle system utilized Preoxygenation: Pre-oxygenation with 100% oxygen Intubation Type: IV induction Ventilation: Mask ventilation without difficulty Laryngoscope Size: Mac and 3 Grade View: Grade I Tube type: Oral Tube size: 7.0 mm Number of attempts: 1 Airway Equipment and Method: Stylet Secured at: 21 cm Tube secured with: Tape Dental Injury: Teeth and Oropharynx as per pre-operative assessment        

## 2015-03-19 NOTE — Progress Notes (Signed)
Portable AP Pelvis X-ray done. 

## 2015-03-19 NOTE — Progress Notes (Signed)
X-ray results noted 

## 2015-03-19 NOTE — Progress Notes (Signed)
Dr. Council Mechanicenenny in- made aware of patient's heart rates-45-51

## 2015-03-20 LAB — BASIC METABOLIC PANEL
ANION GAP: 8 (ref 5–15)
BUN: 7 mg/dL (ref 6–20)
CO2: 27 mmol/L (ref 22–32)
Calcium: 8.8 mg/dL — ABNORMAL LOW (ref 8.9–10.3)
Chloride: 105 mmol/L (ref 101–111)
Creatinine, Ser: 0.49 mg/dL (ref 0.44–1.00)
GFR calc Af Amer: >60 mL/min (ref >60)
GFR calc non Af Amer: >60 mL/min (ref >60)
GLUCOSE: 136 mg/dL — AB (ref 65–99)
POTASSIUM: 3.5 mmol/L (ref 3.5–5.1)
Sodium: 140 mmol/L (ref 135–145)

## 2015-03-20 LAB — CBC
HCT: 35.5 % — ABNORMAL LOW (ref 36.0–46.0)
Hemoglobin: 11.5 g/dL — ABNORMAL LOW (ref 12.0–15.0)
MCH: 29.3 pg (ref 26.0–34.0)
MCHC: 32.4 g/dL (ref 30.0–36.0)
MCV: 90.6 fL (ref 78.0–100.0)
Platelets: 280 K/uL (ref 150–400)
RBC: 3.92 MIL/uL (ref 3.87–5.11)
RDW: 14.3 % (ref 11.5–15.5)
WBC: 14.5 K/uL — ABNORMAL HIGH (ref 4.0–10.5)

## 2015-03-20 MED ORDER — TRAMADOL HCL 50 MG PO TABS: 50.0000 mg | ORAL_TABLET | Freq: Four times a day (QID) | ORAL | Status: DC | PRN

## 2015-03-20 MED ORDER — RIVAROXABAN 10 MG PO TABS: 10.0000 mg | ORAL_TABLET | Freq: Every day | ORAL | Status: DC

## 2015-03-20 MED ORDER — METHOCARBAMOL 500 MG PO TABS: 500.0000 mg | ORAL_TABLET | Freq: Four times a day (QID) | ORAL | Status: DC | PRN

## 2015-03-20 MED ORDER — OXYCODONE HCL 5 MG PO TABS: 5.0000 mg | ORAL_TABLET | ORAL | Status: DC | PRN

## 2015-03-20 NOTE — Progress Notes (Signed)
Utilization review completed.  

## 2015-03-20 NOTE — Evaluation (Signed)
  Occupational Therapy Evaluation Patient Details Name: Tammy [name] MRN: [id number] DOB: 04/23/1951 Today's Date: 03/20/2015    History of Present Illness R DATHA   Clinical Impression   OT education complete.  Pt has AE but would like a tub seat.  Pt may have family look around for most cost efficient option        Equipment Recommendations  Tub/shower seat       Precautions / Restrictions Precautions Precautions: Fall Restrictions Weight Bearing Restrictions: No      Mobility Bed Mobility               General bed mobility comments: pti n chair  Transfers Overall transfer level: Needs assistance Equipment used: Rolling walker (2 wheeled) Transfers: Sit to/from Stand Sit to Stand: Supervision                   ADL Overall ADL's : Needs assistance/impaired                         Toilet Transfer: Supervision/safety;RW   Toileting- ArchitectClothing Manipulation and Hygiene: Supervision/safety;Sit to/from Nurse, children'sstand     Tub/Shower Transfer Details (indicate cue type and reason): verbalized safety- wants a tub seat  Functional mobility during ADLs: Supervision/safety                 Pertinent Vitals/Pain Pain Score: 2  Pain Descriptors / Indicators: Sore;Discomfort Pain Intervention(s): Monitored during session        Extremity/Trunk Assessment Upper Extremity Assessment Upper Extremity Assessment: Overall WFL for tasks assessed           Communication Communication Communication: No difficulties   Cognition Arousal/Alertness: Awake/alert Behavior During Therapy: WFL for tasks assessed/performed Overall Cognitive Status: Within Functional Limits for tasks assessed                                Home Living Family/patient expects to be discharged to:: Private residence Living Arrangements: Children Available Help at Discharge: Family;Available 24 hours/day Type of Home: Apartment Home Access: Stairs to  enter Entrance Stairs-Number of Steps: 24 with 2 landings Entrance Stairs-Rails: Right;Left Home Layout: One level     Bathroom Shower/Tub: Tub/shower unit         Home Equipment: Environmental consultantWalker - 2 wheels;Bedside commode          Prior Functioning/Environment Level of Independence: Independent             OT Diagnosis: Generalized weakness         OT Goals(Current goals can be found in the care plan section) Acute Rehab OT Goals Patient Stated Goal: to walk  OT Frequency:                End of Session Nurse Communication: Mobility status  Activity Tolerance: Patient tolerated treatment well Patient left: in chair   Time: 1235-1247 OT Time Calculation (min): 12 min Charges:  OT General Charges $OT Visit: 1 Procedure OT Evaluation $Initial OT Evaluation Tier I: 1 Procedure G-Codes:    Alba CoryEDDING, Tammy [name] 03/20/2015, 12:57 PM

## 2015-03-20 NOTE — Discharge Instructions (Addendum)
° °Dr. Frank Aluisio °Total Joint Specialist °[facility] °[address] °[place], [place] [address] °[phone] ° °ANTERIOR APPROACH TOTAL HIP REPLACEMENT POSTOPERATIVE DIRECTIONS ° ° °Hip Rehabilitation, Guidelines Following Surgery  °The results of a hip operation are greatly improved after range of motion and muscle strengthening exercises. Follow all safety measures which are given to protect your hip. If any of these exercises cause increased pain or swelling in your joint, decrease the amount until you are comfortable again. Then slowly increase the exercises. Call your caregiver if you have problems or questions.  ° °HOME CARE INSTRUCTIONS  °Remove items at home which could result in a fall. This includes throw rugs or furniture in walking pathways.  °· ICE to the affected hip every three hours for 30 minutes at a time and then as needed for pain and swelling.  Continue to use ice on the hip for pain and swelling from surgery. You may notice swelling that will progress down to the foot and ankle.  This is normal after surgery.  Elevate the leg when you are not up walking on it.   °· Continue to use the breathing machine which will help keep your temperature down.  It is common for your temperature to cycle up and down following surgery, especially at night when you are not up moving around and exerting yourself.  The breathing machine keeps your lungs expanded and your temperature down. ° ° °DIET °You may resume your previous home diet once your are discharged from the hospital. ° °DRESSING / WOUND CARE / SHOWERING °You may shower 3 days after surgery, but keep the wounds dry during showering.  You may use an occlusive plastic wrap (Press'n Seal for example), NO SOAKING/SUBMERGING IN THE BATHTUB.  If the bandage gets wet, change with a clean dry gauze.  If the incision gets wet, pat the wound dry with a clean towel. °You may start showering once you are discharged home but do not  submerge the incision under water. Just pat the incision dry and apply a dry gauze dressing on daily. °Change the surgical dressing daily and reapply a dry dressing each time. ° °ACTIVITY °Walk with your walker as instructed. °Use walker as long as suggested by your caregivers. °Avoid periods of inactivity such as sitting longer than an hour when not asleep. This helps prevent blood clots.  °You may resume a sexual relationship in one month or when given the OK by your doctor.  °You may return to work once you are cleared by your doctor.  °Do not drive a car for 6 weeks or until released by you surgeon.  °Do not drive while taking narcotics. ° °WEIGHT BEARING °Weight bearing as tolerated with assist device (walker, cane, etc) as directed, use it as long as suggested by your surgeon or therapist, typically at least 4-6 weeks. ° °POSTOPERATIVE CONSTIPATION PROTOCOL °Constipation - defined medically as fewer than three stools per week and severe constipation as less than one stool per week. ° °One of the most common issues patients have following surgery is constipation.  Even if you have a regular bowel pattern at home, your normal regimen is likely to be disrupted due to multiple reasons following surgery.  Combination of anesthesia, postoperative narcotics, change in appetite and fluid intake all can affect your bowels.  In order to avoid complications following surgery, here are some recommendations in order to help you during your recovery period. ° °Colace (docusate) - Pick up an over-the-counter   form of Colace or another stool softener and take twice a day as long as you are requiring postoperative pain medications.  Take with a full glass of water daily.  If you experience loose stools or diarrhea, hold the colace until you stool forms back up.  If your symptoms do not get better within 1 week or if they get worse, check with your doctor. ° °Dulcolax (bisacodyl) - Pick up over-the-counter and take as directed  by the product packaging as needed to assist with the movement of your bowels.  Take with a full glass of water.  Use this product as needed if not relieved by Colace only.  ° °MiraLax (polyethylene glycol) - Pick up over-the-counter to have on hand.  MiraLax is a solution that will increase the amount of water in your bowels to assist with bowel movements.  Take as directed and can mix with a glass of water, juice, soda, coffee, or tea.  Take if you go more than two days without a movement. °Do not use MiraLax more than once per day. Call your doctor if you are still constipated or irregular after using this medication for 7 days in a row. ° °If you continue to have problems with postoperative constipation, please contact the office for further assistance and recommendations.  If you experience "the worst abdominal pain ever" or develop nausea or vomiting, please contact the office immediatly for further recommendations for treatment. ° °ITCHING ° If you experience itching with your medications, try taking only a single pain pill, or even half a pain pill at a time.  You can also use Benadryl over the counter for itching or also to help with sleep.  ° °TED HOSE STOCKINGS °Wear the elastic stockings on both legs for three weeks following surgery during the day but you may remove then at night for sleeping. ° °MEDICATIONS °See your medication summary on the “After Visit Summary” that the nursing staff will review with you prior to discharge.  You may have some home medications which will be placed on hold until you complete the course of blood thinner medication.  It is important for you to complete the blood thinner medication as prescribed by your surgeon.  Continue your approved medications as instructed at time of discharge. ° °PRECAUTIONS °If you experience chest pain or shortness of breath - call 911 immediately for transfer to the hospital emergency department.  °If you develop a fever greater that 101 F,  purulent drainage from wound, increased redness or drainage from wound, foul odor from the wound/dressing, or calf pain - CONTACT YOUR SURGEON.   °                                                °FOLLOW-UP APPOINTMENTS °Make sure you keep all of your appointments after your operation with your surgeon and caregivers. You should call the office at the above phone number and make an appointment for approximately two weeks after the date of your surgery or on the date instructed by your surgeon outlined in the "After Visit Summary". ° °RANGE OF MOTION AND STRENGTHENING EXERCISES  °These exercises are designed to help you keep full movement of your hip joint. Follow your caregiver's or physical therapist's instructions. Perform all exercises about fifteen times, three times per day or as directed. Exercise both hips, even if you   have had only one joint replacement. These exercises can be done on a training (exercise) mat, on the floor, on a table or on a bed. Use whatever works the best and is most comfortable for you. Use music or television while you are exercising so that the exercises are a pleasant break in your day. This will make your life better with the exercises acting as a break in routine you can look forward to.  Lying on your back, slowly slide your foot toward your buttocks, raising your knee up off the floor. Then slowly slide your foot back down until your leg is straight again.  Lying on your back spread your legs as far apart as you can without causing discomfort.  Lying on your side, raise your upper leg and foot straight up from the floor as far as is comfortable. Slowly lower the leg and repeat.  Lying on your back, tighten up the muscle in the front of your thigh (quadriceps muscles). You can do this by keeping your leg straight and trying to raise your heel off the floor. This helps strengthen the largest muscle supporting your knee.  Lying on your back, tighten up the muscles of your  buttocks both with the legs straight and with the knee bent at a comfortable angle while keeping your heel on the floor.   IF YOU ARE TRANSFERRED TO A SKILLED REHAB FACILITY If the patient is transferred to a skilled rehab facility following release from the hospital, a list of the current medications will be sent to the facility for the patient to continue.  When discharged from the skilled rehab facility, please have the facility set up the patient's Home Health Physical Therapy prior to being released. Also, the skilled facility will be responsible for providing the patient with their medications at time of release from the facility to include their pain medication, the muscle relaxants, and their blood thinner medication. If the patient is still at the rehab facility at time of the two week follow up appointment, the skilled rehab facility will also need to assist the patient in arranging follow up appointment in our office and any transportation needs.  MAKE SURE YOU:  Understand these instructions.  Get help right away if you are not doing well or get worse.    Pick up stool softner and laxative for home use following surgery while on pain medications. Do not submerge incision under water. May remove the surgical dressing tomorrow, Friday 03/21/2015 and then apply a dry gauze dressing daily. Please use good hand washing techniques while changing dressing each day. May shower starting three days after surgery Saturday 03/22/2015. Please use a clean towel to pat the incision dry following showers. Continue to use ice for pain and swelling after surgery. Do not use any lotions or creams on the incision until instructed by your surgeon.  Take Xarelto for two and a half more weeks, then discontinue Xarelto. Once the patient has completed the Xarelto, they may resume the 81 mg Aspirin.   Information on my medicine - XARELTO (Rivaroxaban)  This medication education was reviewed with me or my  healthcare representative as part of my discharge preparation.  The pharmacist that spoke with me during my hospital stay was:  Jamse MeadGadhia, Ermalinda Joubert M, [facility]  Why was Xarelto prescribed for you? Xarelto was prescribed for you to reduce the risk of blood clots forming after orthopedic surgery. The medical term for these abnormal blood clots is venous thromboembolism (VTE).  What  do you need to know about xarelto ? Take your Xarelto ONCE DAILY at the same time every day. You may take it either with or without food.  If you have difficulty swallowing the tablet whole, you may crush it and mix in applesauce just prior to taking your dose.  Take Xarelto exactly as prescribed by your doctor and DO NOT stop taking Xarelto without talking to the doctor who prescribed the medication.  Stopping without other VTE prevention medication to take the place of Xarelto may increase your risk of developing a clot.  After discharge, you should have regular check-up appointments with your healthcare provider that is prescribing your Xarelto.    What do you do if you miss a dose? If you miss a dose, take it as soon as you remember on the same day then continue your regularly scheduled once daily regimen the next day. Do not take two doses of Xarelto on the same day.   Important Safety Information A possible side effect of Xarelto is bleeding. You should call your healthcare provider right away if you experience any of the following: ? Bleeding from an injury or your nose that does not stop. ? Unusual colored urine (red or dark brown) or unusual colored stools (red or black). ? Unusual bruising for unknown reasons. ? A serious fall or if you hit your head (even if there is no bleeding).  Some medicines may interact with Xarelto and might increase your risk of bleeding while on Xarelto. To help avoid this, consult your healthcare provider or pharmacist prior to using any new prescription or non-prescription  medications, including herbals, vitamins, non-steroidal anti-inflammatory drugs (NSAIDs) and supplements.  This website has more information on Xarelto: [email or web address]

## 2015-03-20 NOTE — Progress Notes (Signed)
Physical Therapy Treatment Patient Details Name: Katharina Capermanda L Lussier MRN: [id number] DOB: 06/16/1950 Today's Date: 03/20/2015    History of Present Illness R DATHA    PT Comments    POD # 1 am session. Assisted with amb in hallway then performed THR TE's following HEP handout followed by ICE.  Pt very educated from prior THR on L.     Follow Up Recommendations  Home health PT;Supervision/Assistance - 24 hour     Equipment Recommendations  None recommended by PT    Recommendations for Other Services       Precautions / Restrictions Precautions Precautions: Fall Restrictions Weight Bearing Restrictions: No    Mobility  Bed Mobility               General bed mobility comments: Pt OOB in recliner  Transfers Overall transfer level: Needs assistance Equipment used: Rolling walker (2 wheeled) Transfers: Sit to/from Stand Sit to Stand: Supervision;Min guard         General transfer comment: 25% VC's safety/pt impulsive  Ambulation/Gait Ambulation/Gait assistance: Min guard;Supervision Ambulation Distance (Feet): 75 Feet Assistive device: Rolling walker (2 wheeled) Gait Pattern/deviations: Step-to pattern;Step-through pattern;Trunk flexed Gait velocity: decreased   General Gait Details: 25% VC's safety with turns using AD   Stairs            Wheelchair Mobility    Modified Rankin (Stroke Patients Only)       Balance                                    Cognition Arousal/Alertness: Awake/alert Behavior During Therapy: WFL for tasks assessed/performed Overall Cognitive Status: Within Functional Limits for tasks assessed                      Exercises   Total Hip Replacement TE's 10 reps ankle pumps 10 reps knee presses 10 reps heel slides 10 reps SAQ's 10 reps ABD Followed by ICE     General Comments        Pertinent Vitals/Pain Pain Assessment: 0-10 Pain Score: 2  Pain Location: R hip Pain Descriptors /  Indicators: Tender;Sore Pain Intervention(s): Monitored during session;Premedicated before session;Repositioned;Ice applied    Home Living     Available Help at Discharge: Family;Available 24 hours/day Type of Home: Apartment Home Access: Stairs to enter Entrance Stairs-Rails: Right;Left Home Layout: One level Home Equipment: Environmental consultantWalker - 2 wheels;Bedside commode      Prior Function Level of Independence: Independent          PT Goals (current goals can now be found in the care plan section) Acute Rehab PT Goals Patient Stated Goal: to walk Progress towards PT goals: Progressing toward goals    Frequency  7X/week    PT Plan Current plan remains appropriate    Co-evaluation             End of Session Equipment Utilized During Treatment: Gait belt Activity Tolerance: Patient tolerated treatment well Patient left: in chair;with call bell/phone within reach;with chair alarm set     Time: [id number] PT Time Calculation (min) (ACUTE ONLY): 25 min  Charges:  $Gait Training: 8-22 mins $Therapeutic Exercise: 8-22 mins                    G Codes:      Felecia ShellingLori Barett Whidbee  PTA [facility]  Acute  Rehab Pager  [phone]  

## 2015-03-20 NOTE — Progress Notes (Signed)
Physical Therapy Treatment Patient Details Name: Tammy [name] MRN: [id number] DOB: 04/04/1951 Today's Date: 03/20/2015    History of Present Illness R DATHA    PT Comments    POD # 1 pm session.  Assisted with amb in hallway and practiced a flight of stairs twice.  Pt ready for D/C to home.   Follow Up Recommendations  Home health PT;Supervision/Assistance - 24 hour     Equipment Recommendations  None recommended by PT    Recommendations for Other Services       Precautions / Restrictions Precautions Precautions: Fall Restrictions Weight Bearing Restrictions: No    Mobility  Bed Mobility               General bed mobility comments: Pt OOB in recliner  Transfers Overall transfer level: Needs assistance Equipment used: Rolling walker (2 wheeled) Transfers: Sit to/from Stand Sit to Stand: Supervision;Min guard         General transfer comment: 25% VC's safety/pt impulsive  Ambulation/Gait Ambulation/Gait assistance: Min guard;Supervision Ambulation Distance (Feet): 75 Feet Assistive device: Rolling walker (2 wheeled) Gait Pattern/deviations: Step-to pattern;Step-through pattern;Trunk flexed Gait velocity: decreased   General Gait Details: 25% VC's safety with turns using AD   Stairs Stairs: Yes Stairs assistance: Min guard Stair Management: One rail Right;Step to pattern;Forwards Number of Stairs: 12 General stair comments: practiced a flight of stairs.  Pt preferred using 2 hands on R rail.  50% VC's on "up with the good" as pt had prior hip surgery her "good" has changed.  Coming down therapist was in front and pt used one L rail and therapist shoulder.  50% VC's for "down with the bad".  performed twice per pt request.    Wheelchair Mobility    Modified Rankin (Stroke Patients Only)       Balance                                    Cognition Arousal/Alertness: Awake/alert Behavior During Therapy: WFL for tasks  assessed/performed Overall Cognitive Status: Within Functional Limits for tasks assessed                      Exercises      General Comments        Pertinent Vitals/Pain Pain Assessment: 0-10 Pain Score: 2  Pain Location: R hip Pain Descriptors / Indicators: Tender;Sore Pain Intervention(s): Monitored during session;Premedicated before session;Repositioned;Ice applied    Home Living     Available Help at Discharge: Family;Available 24 hours/day Type of Home: Apartment Home Access: Stairs to enter Entrance Stairs-Rails: Right;Left Home Layout: One level Home Equipment: Environmental consultantWalker - 2 wheels;Bedside commode      Prior Function Level of Independence: Independent          PT Goals (current goals can now be found in the care plan section) Acute Rehab PT Goals Patient Stated Goal: to walk Progress towards PT goals: Progressing toward goals    Frequency  7X/week    PT Plan Current plan remains appropriate    Co-evaluation             End of Session Equipment Utilized During Treatment: Gait belt Activity Tolerance: Patient tolerated treatment well Patient left: in chair;with call bell/phone within reach;with chair alarm set     Time: 1330-1355 PT Time Calculation (min) (ACUTE ONLY): 25 min  Charges:  $Gait Training: 8-22 mins $Therapeutic Activity: 8-22  mins                    G Codes:      Rica Koyanagi  PTA [facility]  Acute  Rehab Pager      [phone]

## 2015-03-20 NOTE — Consult Note (Signed)
   [facility]   03/20/2015  Katharina Capermanda L Zoll 02/17/1951 [id number]   Came to visit patient at bedside on behalf of [facility] [employer] [facility] for [facility] employees/dependents. Patient has been active in the past [facility]. She denies having any needs at this time [facility]. Unable to speak with her long as she was about to work with therapy.   Raiford NobleAtika Mckenzi Buonomo, MSN-Ed, RN,BSN [facility] Hospital Liaison [phone]

## 2015-03-20 NOTE — Progress Notes (Signed)
   Subjective: 1 Day Post-Op Procedure(s) (LRB): TOTAL HIP ARTHROPLASTY ANTERIOR APPROACH (Right) Patient reports pain as mild.   Patient seen in rounds by Dr. Lequita HaltAluisio.  She is doing well this morning. Patient is well, and has had no acute complaints or problems We will start therapy today. If she does very well and meets goals, then home later today after therapy. Plan is to go Home after hospital stay.  Objective: Vital signs in last 24 hours: Temp:  [97.6 F (36.4 C)-98.5 F (36.9 C)] 98.5 F (36.9 C) (11/17 0444) Pulse Rate:  [48-68] 64 (11/17 0444) Resp:  [11-16] 16 (11/17 0444) BP: (118-167)/(54-101) 137/57 mmHg (11/17 0444) SpO2:  [98 %-100 %] 98 % (11/17 0444) Weight:  [101.606 kg (224 lb)] 101.606 kg (224 lb) (11/16 1145)  Intake/Output from previous day:  Intake/Output Summary (Last 24 hours) at 03/20/15 0713 Last data filed at 03/20/15 0444  Gross per 24 hour  Intake   4035 ml  Output   2530 ml  Net   1505 ml    Intake/Output this shift: UOP 1250  Labs:  Recent Labs  03/20/15 0418  HGB 11.5*    Recent Labs  03/20/15 0418  WBC 14.5*  RBC 3.92  HCT 35.5*  PLT 280    Recent Labs  03/20/15 0418  NA 140  K 3.5  CL 105  CO2 27  BUN 7  CREATININE 0.49  GLUCOSE 136*  CALCIUM 8.8*   No results for input(s): LABPT, INR in the last 72 hours.  EXAM General - Patient is Alert, Appropriate and Oriented Extremity - Neurovascular intact Sensation intact distally Dorsiflexion/Plantar flexion intact Dressing - dressing C/D/I Motor Function - intact, moving foot and toes well on exam.  Hemovac pulled without difficulty.  Past Medical History  Diagnosis Date  . Hypertension   . Hyperlipidemia   . Heart murmur     hx of   . Depression   . Arthritis   . PONV (postoperative nausea and vomiting)   . Anxiety     Assessment/Plan: 1 Day Post-Op Procedure(s) (LRB): TOTAL HIP ARTHROPLASTY ANTERIOR APPROACH (Right) Active Problems:   OA  (osteoarthritis) of hip  Estimated body mass index is 38.43 kg/(m^2) as calculated from the following:   Height as of this encounter: 5\' 4"  (1.626 m).   Weight as of this encounter: 101.606 kg (224 lb). Advance diet Up with therapy Discharge home with home health  DVT Prophylaxis - Xarelto Weight Bearing As Tolerated right Leg Hemovac Pulled Begin Therapy  If meets goals and able to go home: Discharge home with home health Diet - Cardiac diet Follow up - in 2 weeks on Thursday 04/03/2015 Activity - WBAT Disposition - Home Condition Upon Discharge - Pending D/C Meds - See DC Summary DVT Prophylaxis - Xarelto  Avel Peacerew Urijah Raynor, PA-C Orthopaedic Surgery 03/20/2015, 7:13 AM

## 2015-03-20 NOTE — Discharge Summary (Signed)
Physician Discharge Summary   Patient ID: Tammy [name] MRN: [id number] DOB/AGE: [birth year] 64 y.o.  Admit date: 03/19/2015 Discharge date: 03-20-2015  Primary Diagnosis:  Osteoarthritis of the Right hip.  Admission Diagnoses:  Past Medical History  Diagnosis Date  . Hypertension   . Hyperlipidemia   . Heart murmur     hx of   . Depression   . Arthritis   . PONV (postoperative nausea and vomiting)   . Anxiety    Discharge Diagnoses:   Active Problems:   OA (osteoarthritis) of hip  Estimated body mass index is 38.43 kg/(m^2) as calculated from the following:   Height as of this encounter: _0  (1.626 m).   Weight as of this encounter: 101.606 kg (224 lb).  Procedure(s) (LRB): TOTAL HIP ARTHROPLASTY ANTERIOR APPROACH (Right)   Consults: None  HPI: Tammy [name] is a 63 y.o. female who has advanced end-  stage arthritis of her Right hip with progressively worsening pain and  dysfunction.The patient has failed nonoperative management and presents for  total hip arthroplasty.  Laboratory Data: Admission on 03/19/2015  Component Date Value Ref Range Status  . WBC 03/20/2015 14.5* 4.0 - 10.5 K/uL Final  . RBC 03/20/2015 3.92  3.87 - 5.11 MIL/uL Final  . Hemoglobin 03/20/2015 11.5* 12.0 - 15.0 g/dL Final  . HCT 03/20/2015 35.5* 36.0 - 46.0 % Final  . MCV 03/20/2015 90.6  78.0 - 100.0 fL Final  . MCH 03/20/2015 29.3  26.0 - 34.0 pg Final  . MCHC 03/20/2015 32.4  30.0 - 36.0 g/dL Final  . RDW 03/20/2015 14.3  11.5 - 15.5 % Final  . Platelets 03/20/2015 280  150 - 400 K/uL Final  . Sodium 03/20/2015 140  135 - 145 mmol/L Final  . Potassium 03/20/2015 3.5  3.5 - 5.1 mmol/L Final  . Chloride 03/20/2015 105  101 - 111 mmol/L Final  . CO2 03/20/2015 27  22 - 32 mmol/L Final  . Glucose, Bld 03/20/2015 136* 65 - 99 mg/dL Final  . BUN 03/20/2015 7  6 - 20 mg/dL Final  . Creatinine, Ser 03/20/2015 0.49  0.44 - 1.00 mg/dL Final  . Calcium 03/20/2015 8.8* 8.9 - 10.3 mg/dL  Final  . GFR calc non Af Amer 03/20/2015 >60  >60 mL/min Final  . GFR calc Af Amer 03/20/2015 >60  >60 mL/min Final   Comment: (NOTE) The eGFR has been calculated using the CKD EPI equation. This calculation has not been validated in all clinical situations. eGFR's persistently <60 mL/min signify possible Chronic Kidney Disease.   Georgiann Hahn gap 03/20/2015 8  5 - 15 Final  Hospital Outpatient Visit on 03/12/2015  Component Date Value Ref Range Status  . aPTT 03/12/2015 40* 24 - 37 seconds Final   Comment:        IF BASELINE aPTT IS ELEVATED, SUGGEST PATIENT RISK ASSESSMENT BE USED TO DETERMINE APPROPRIATE ANTICOAGULANT THERAPY.   . WBC 03/12/2015 11.0* 4.0 - 10.5 K/uL Final  . RBC 03/12/2015 4.26  3.87 - 5.11 MIL/uL Final  . Hemoglobin 03/12/2015 12.3  12.0 - 15.0 g/dL Final  . HCT 03/12/2015 38.0  36.0 - 46.0 % Final  . MCV 03/12/2015 89.2  78.0 - 100.0 fL Final  . MCH 03/12/2015 28.9  26.0 - 34.0 pg Final  . MCHC 03/12/2015 32.4  30.0 - 36.0 g/dL Final  . RDW 03/12/2015 14.2  11.5 - 15.5 % Final  . Platelets 03/12/2015 313  150 - 400 K/uL Final  .  Sodium 03/12/2015 140  135 - 145 mmol/L Final  . Potassium 03/12/2015 3.6  3.5 - 5.1 mmol/L Final  . Chloride 03/12/2015 105  101 - 111 mmol/L Final  . CO2 03/12/2015 26  22 - 32 mmol/L Final  . Glucose, Bld 03/12/2015 82  65 - 99 mg/dL Final  . BUN 03/12/2015 21* 6 - 20 mg/dL Final  . Creatinine, Ser 03/12/2015 0.56  0.44 - 1.00 mg/dL Final  . Calcium 03/12/2015 9.2  8.9 - 10.3 mg/dL Final  . Total Protein 03/12/2015 7.1  6.5 - 8.1 g/dL Final  . Albumin 03/12/2015 4.1  3.5 - 5.0 g/dL Final  . AST 03/12/2015 23  15 - 41 U/L Final  . ALT 03/12/2015 17  14 - 54 U/L Final  . Alkaline Phosphatase 03/12/2015 106  38 - 126 U/L Final  . Total Bilirubin 03/12/2015 0.8  0.3 - 1.2 mg/dL Final  . GFR calc non Af Amer 03/12/2015 >60  >60 mL/min Final  . GFR calc Af Amer 03/12/2015 >60  >60 mL/min Final   Comment: (NOTE) The eGFR has been  calculated using the CKD EPI equation. This calculation has not been validated in all clinical situations. eGFR's persistently <60 mL/min signify possible Chronic Kidney Disease.   . Anion gap 03/12/2015 9  5 - 15 Final  . Prothrombin Time 03/12/2015 14.0  11.6 - 15.2 seconds Final  . INR 03/12/2015 1.06  0.00 - 1.49 Final  . ABO/RH(D) 03/12/2015 O POS   Final  . Antibody Screen 03/12/2015 NEG   Final  . Sample Expiration 03/12/2015 03/22/2015   Final  . Extend sample reason 03/12/2015 NO TRANSFUSIONS OR PREGNANCY IN THE PAST 3 MONTHS   Final  . Color, Urine 03/12/2015 YELLOW  YELLOW Final  . APPearance 03/12/2015 CLOUDY* CLEAR Final  . Specific Gravity, Urine 03/12/2015 1.030  1.005 - 1.030 Final  . pH 03/12/2015 6.0  5.0 - 8.0 Final  . Glucose, UA 03/12/2015 NEGATIVE  NEGATIVE mg/dL Final  . Hgb urine dipstick 03/12/2015 TRACE* NEGATIVE Final  . Bilirubin Urine 03/12/2015 NEGATIVE  NEGATIVE Final  . Ketones, ur 03/12/2015 NEGATIVE  NEGATIVE mg/dL Final  . Protein, ur 03/12/2015 NEGATIVE  NEGATIVE mg/dL Final  . Urobilinogen, UA 03/12/2015 1.0  0.0 - 1.0 mg/dL Final  . Nitrite 03/12/2015 NEGATIVE  NEGATIVE Final  . Leukocytes, UA 03/12/2015 NEGATIVE  NEGATIVE Final  . MRSA, PCR 03/12/2015 NEGATIVE  NEGATIVE Final  . Staphylococcus aureus 03/12/2015 NEGATIVE  NEGATIVE Final   Comment:        The Xpert SA Assay (FDA approved for NASAL specimens in patients over 75 years of age), is one component of a comprehensive surveillance program.  Test performance has been validated by [facility] for patients greater than or equal to 50 year old. It is not intended to diagnose infection nor to guide or monitor treatment.   . Squamous Epithelial / LPF 03/12/2015 MANY* RARE Final  . WBC, UA 03/12/2015 7-10  <3 WBC/hpf Final  . RBC / HPF 03/12/2015 0-2  <3 RBC/hpf Final  . Bacteria, UA 03/12/2015 MANY* RARE Final  . Crystals 03/12/2015 CA OXALATE CRYSTALS* NEGATIVE Final  . Urine-Other  03/12/2015 LESS THAN 10 mL OF URINE SUBMITTED   Final   MICROSCOPIC EXAM PERFORMED ON UNCONCENTRATED URINE     X-Rays:Dg Pelvis Portable  03/19/2015  CLINICAL DATA:  Status post right hip replacement EXAM: PORTABLE PELVIS 1-2 VIEWS COMPARISON:  01/16/2014 FINDINGS: A right hip prosthesis is now seen. Previously  placed left hip prosthesis is again noted. No acute bony abnormality is seen. No soft tissue changes are noted. IMPRESSION: Status post right hip replacement no acute abnormality noted. Electronically Signed   By: Inez Catalina M.D.   On: 03/19/2015 11:16   Dg C-arm 1-60 Min-no Report  03/19/2015  CLINICAL DATA: surgery C-ARM 1-60 MINUTES Fluoroscopy was utilized by the requesting physician.  No radiographic interpretation.    EKG: Orders placed or performed during the hospital encounter of 03/12/15  . EKG 12-Lead  . EKG 12-Lead     Hospital Course: Patient was admitted to [facility] and taken to the OR and underwent the above state procedure without complications.  Patient tolerated the procedure well and was later transferred to the recovery room and then to the orthopaedic floor for postoperative care.  They were given PO and IV analgesics for pain control following their surgery.  They were given 24 hours of postoperative antibiotics of  Anti-infectives    Start     Dose/Rate Route Frequency Ordered Stop   03/19/15 1400  ceFAZolin (ANCEF) IVPB 2 g/50 mL premix     2 g 100 mL/hr over 30 Minutes Intravenous Every 6 hours 03/19/15 1151 03/19/15 2137   03/19/15 0633  ceFAZolin (ANCEF) IVPB 2 g/50 mL premix     2 g 100 mL/hr over 30 Minutes Intravenous On call to O.R. 03/19/15 6644 03/19/15 0859     and started on DVT prophylaxis in the form of Xarelto.   PT and OT were ordered for total hip protocol.  The patient was allowed to be WBAT with therapy. Discharge planning was consulted to help with postop disposition and equipment needs.  Patient had a good night on the  evening of surgery.  They started to get up OOB with therapy on day one.  Hemovac drain was pulled without difficulty.  Dressing was checked on day one and the dressing was clean and dry. Patient was seen in rounds on day one by Dr. Wynelle Link and as long as she did well with therapy and met her goals, she was set up to go home later that afternoon following therapy.  Discharge home with home health Diet - Cardiac diet Follow up - in 2 weeks on Thursday 04/03/2015 Activity - WBAT Disposition - Home Condition Upon Discharge - good D/C Meds - See DC Summary DVT Prophylaxis -    Discharge Instructions    Call MD / Call 911    Complete by:  As directed   If you experience chest pain or shortness of breath, CALL 911 and be transported to the hospital emergency room.  If you develope a fever above 101 F, pus (white drainage) or increased drainage or redness at the wound, or calf pain, call your surgeon's office.     Change dressing    Complete by:  As directed   You may change your dressing dressing daily with sterile 4 x 4 inch gauze dressing and paper tape.  Do not submerge the incision under water.     Constipation Prevention    Complete by:  As directed   Drink plenty of fluids.  Prune juice may be helpful.  You may use a stool softener, such as Colace (over the counter) 100 mg twice a day.  Use MiraLax (over the counter) for constipation as needed.     Diet - low sodium heart healthy    Complete by:  As directed      Discharge instructions  Complete by:  As directed   Pick up stool softner and laxative for home use following surgery while on pain medications. Do not submerge incision under water. Please use good hand washing techniques while changing dressing each day. May shower starting three days after surgery. Please use a clean towel to pat the incision dry following showers. Continue to use ice for pain and swelling after surgery. Do not use any lotions or creams on the incision  until instructed by your surgeon.  Total Hip Protocol.  Take Xarelto for two and a half more weeks, then discontinue Xarelto. Once the patient has completed the Xarelto, they may resume the 81 mg Aspirin.  Postoperative Constipation Protocol  Constipation - defined medically as fewer than three stools per week and severe constipation as less than one stool per week.  One of the most common issues patients have following surgery is constipation.  Even if you have a regular bowel pattern at home, your normal regimen is likely to be disrupted due to multiple reasons following surgery.  Combination of anesthesia, postoperative narcotics, change in appetite and fluid intake all can affect your bowels.  In order to avoid complications following surgery, here are some recommendations in order to help you during your recovery period.  Colace (docusate) - Pick up an over-the-counter form of Colace or another stool softener and take twice a day as long as you are requiring postoperative pain medications.  Take with a full glass of water daily.  If you experience loose stools or diarrhea, hold the colace until you stool forms back up.  If your symptoms do not get better within 1 week or if they get worse, check with your doctor.  Dulcolax (bisacodyl) - Pick up over-the-counter and take as directed by the product packaging as needed to assist with the movement of your bowels.  Take with a full glass of water.  Use this product as needed if not relieved by Colace only.   MiraLax (polyethylene glycol) - Pick up over-the-counter to have on hand.  MiraLax is a solution that will increase the amount of water in your bowels to assist with bowel movements.  Take as directed and can mix with a glass of water, juice, soda, coffee, or tea.  Take if you go more than two days without a movement. Do not use MiraLax more than once per day. Call your doctor if you are still constipated or irregular after using this  medication for 7 days in a row.  If you continue to have problems with postoperative constipation, please contact the office for further assistance and recommendations.  If you experience "the worst abdominal pain ever" or develop nausea or vomiting, please contact the office immediatly for further recommendations for treatment.     Do not sit on low chairs, stoools or toilet seats, as it may be difficult to get up from low surfaces    Complete by:  As directed      Driving restrictions    Complete by:  As directed   No driving until released by the physician.     Increase activity slowly as tolerated    Complete by:  As directed      Lifting restrictions    Complete by:  As directed   No lifting until released by the physician.     Patient may shower    Complete by:  As directed   You may shower without a dressing once there is no drainage.  Do  not wash over the wound.  If drainage remains, do not shower until drainage stops.     TED hose    Complete by:  As directed   Use stockings (TED hose) for 3 weeks on both leg(s).  You may remove them at night for sleeping.     Weight bearing as tolerated    Complete by:  As directed   Laterality:  right  Extremity:  Lower            Medication List    STOP taking these medications        aspirin EC 81 MG tablet     meloxicam 15 MG tablet  Commonly known as:  MOBIC      TAKE these medications        acetaminophen 500 MG tablet  Commonly known as:  TYLENOL  Take 500 mg by mouth every 6 (six) hours as needed.     atorvastatin 40 MG tablet  Commonly known as:  LIPITOR  Take 40 mg by mouth daily. Takes in the pm     diphenhydrAMINE 25 MG tablet  Commonly known as:  BENADRYL  Take 25 mg by mouth every 6 (six) hours as needed for allergies (for headaches).     escitalopram 10 MG tablet  Commonly known as:  LEXAPRO  Take 10 mg by mouth every evening.     felodipine 10 MG 24 hr tablet  Commonly known as:  PLENDIL  Take 10 mg  by mouth daily. Takes in the evening     lisinopril-hydrochlorothiazide 20-12.5 MG tablet  Commonly known as:  PRINZIDE,ZESTORETIC  Take 1 tablet by mouth every evening.     methocarbamol 500 MG tablet  Commonly known as:  ROBAXIN  Take 1 tablet (500 mg total) by mouth every 6 (six) hours as needed for muscle spasms.     metoprolol succinate 100 MG 24 hr tablet  Commonly known as:  TOPROL-XL  Take 100 mg by mouth daily. Take with or immediately following a meal.  Takes in the pm     oxyCODONE 5 MG immediate release tablet  Commonly known as:  Oxy IR/ROXICODONE  Take 1-2 tablets (5-10 mg total) by mouth every 3 (three) hours as needed for moderate pain or severe pain.     rivaroxaban 10 MG Tabs tablet  Commonly known as:  XARELTO  Take 1 tablet (10 mg total) by mouth daily with breakfast. Take Xarelto for two and a half more weeks, then discontinue Xarelto. Once the patient has completed the Xarelto, they may resume the 81 mg Aspirin.     traMADol 50 MG tablet  Commonly known as:  ULTRAM  Take 1-2 tablets (50-100 mg total) by mouth every 6 (six) hours as needed (mild pain).           Follow-up Information    Follow up with Gearlean Alf, MD. Schedule an appointment as soon as possible for a visit on 04/03/2015.   Specialty:  Orthopedic Surgery   Why:  Call office at [phone] to setup appointment on Thursday 04/03/2015 with Dr. Wynelle Link.   Contact information:   [address] [place] [address] [phone]       Signed: Arlee Muslim, PA-C Orthopaedic Surgery 03/20/2015, 7:23 AM

## 2015-05-07 MED FILL — METOPROLOL SUCC ER 100 MG T: 100 | 30 days supply | Qty: 30 | Fill #2

## 2015-05-19 MED FILL — traMADol HCL 50 MG TABS: 50 | 11 days supply | Qty: 90 | Fill #0

## 2015-05-29 DIAGNOSIS — M1711 Unilateral primary osteoarthritis, right knee: Secondary | ICD-10-CM | POA: Diagnosis not present

## 2015-06-18 MED FILL — FELODIPINE ER 10 MG TABLET: 10 | 30 days supply | Qty: 30 | Fill #0

## 2015-06-18 MED FILL — METOPROLOL SUCC ER 100 MG T: 100 | 30 days supply | Qty: 30 | Fill #0

## 2015-06-23 MED FILL — ESCITALOPRAM 10 MG TABLET: 10 | 90 days supply | Qty: 90 | Fill #0

## 2015-07-11 MED FILL — CHLORHEXIDINE 0.12% RINSE: 0.12 | 16 days supply | Qty: 473 | Fill #0

## 2015-07-11 MED FILL — AMOXICILLIN 500 MG CAPSULE: 500 | 4 days supply | Qty: 16 | Fill #0

## 2015-07-11 MED FILL — PREVIDENT 5000 BOOSTER PLUS: 1.1 | 30 days supply | Qty: 100 | Fill #0

## 2015-07-11 MED FILL — LISINOPRIL-HCTZ 20-12.5 MG: 20-12.5 | 90 days supply | Qty: 180 | Fill #0

## 2015-07-15 DIAGNOSIS — F3341 Major depressive disorder, recurrent, in partial remission: Secondary | ICD-10-CM | POA: Diagnosis not present

## 2015-07-15 DIAGNOSIS — I10 Essential (primary) hypertension: Secondary | ICD-10-CM | POA: Diagnosis not present

## 2015-07-15 DIAGNOSIS — E785 Hyperlipidemia, unspecified: Secondary | ICD-10-CM | POA: Diagnosis not present

## 2015-07-15 DIAGNOSIS — R0609 Other forms of dyspnea: Secondary | ICD-10-CM | POA: Diagnosis not present

## 2015-07-15 MED FILL — METOPROLOL SUCC ER 100 MG T: 100 | 90 days supply | Qty: 90 | Fill #0

## 2015-07-15 MED FILL — FELODIPINE ER 10 MG TABLET: 10 | 90 days supply | Qty: 90 | Fill #0

## 2015-07-15 MED FILL — ATORVASTATIN 40 MG TABLET: 40 | 90 days supply | Qty: 90 | Fill #0

## 2015-08-04 ENCOUNTER — Encounter: Payer: Self-pay | Admitting: Cardiology

## 2015-08-04 DIAGNOSIS — F419 Anxiety disorder, unspecified: Secondary | ICD-10-CM | POA: Insufficient documentation

## 2015-08-04 DIAGNOSIS — R0602 Shortness of breath: Secondary | ICD-10-CM | POA: Insufficient documentation

## 2015-08-04 DIAGNOSIS — E785 Hyperlipidemia, unspecified: Secondary | ICD-10-CM | POA: Insufficient documentation

## 2015-08-04 DIAGNOSIS — R011 Cardiac murmur, unspecified: Secondary | ICD-10-CM | POA: Insufficient documentation

## 2015-08-04 DIAGNOSIS — I10 Essential (primary) hypertension: Secondary | ICD-10-CM | POA: Insufficient documentation

## 2015-08-04 HISTORY — DX: Essential (primary) hypertension: I10

## 2015-08-04 NOTE — Progress Notes (Deleted)
Cardiology Office Note    Date:  08/04/2015   ID:  Tammy Capermanda L Eickhoff, DOB 07/19/1950, MRN [id number]  PCP:  Leanor RubensteinSUN,VYVYAN Y, MD  Cardiologist:  Quintella ReichertURNER,TRACI R, MD   No chief complaint on file.   History of Present Illness:  Tammy [name] is a 65 y.o. female with a history of HTN, dyslipidemia, heart murmur and anxiety who is referred by her PCP for evaluation of SOB and poorly controlled HTN.      Past Medical History  Diagnosis Date  . Hypertension   . Hyperlipidemia   . Heart murmur     hx of   . Depression   . Arthritis   . PONV (postoperative nausea and vomiting)   . Anxiety     Past Surgical History  Procedure Laterality Date  . Total hip arthroplasty Left 01/16/2014    Procedure: LEFT TOTAL HIP ARTHROPLASTY ANTERIOR APPROACH;  Surgeon: Loanne DrillingFrank Aluisio V, MD;  Location: [facility];  Service: Orthopedics;  Laterality: Left;  . Total hip arthroplasty Right 03/19/2015    Procedure: TOTAL HIP ARTHROPLASTY ANTERIOR APPROACH;  Surgeon: Ollen GrossFrank Aluisio, MD;  Location: [facility];  Service: Orthopedics;  Laterality: Right;    Current Medications: Outpatient Prescriptions Prior to Visit  Medication Sig Dispense Refill  . acetaminophen (TYLENOL) 500 MG tablet Take 500 mg by mouth every 6 (six) hours as needed.    Marland Kitchen. atorvastatin (LIPITOR) 40 MG tablet Take 40 mg by mouth daily. Takes in the pm    . diphenhydrAMINE (BENADRYL) 25 MG tablet Take 25 mg by mouth every 6 (six) hours as needed for allergies (for headaches).    . escitalopram (LEXAPRO) 10 MG tablet Take 10 mg by mouth every evening.    . felodipine (PLENDIL) 10 MG 24 hr tablet Take 10 mg by mouth daily. Takes in the evening    . lisinopril-hydrochlorothiazide (PRINZIDE,ZESTORETIC) 20-12.5 MG per tablet Take 1 tablet by mouth every evening.    . methocarbamol (ROBAXIN) 500 MG tablet Take 1 tablet (500 mg total) by mouth every 6 (six) hours as needed for muscle spasms. 90 tablet 0  . metoprolol succinate (TOPROL-XL) 100 MG 24 hr tablet  Take 100 mg by mouth daily. Take with or immediately following a meal.  Takes in the pm    . oxyCODONE (OXY IR/ROXICODONE) 5 MG immediate release tablet Take 1-2 tablets (5-10 mg total) by mouth every 3 (three) hours as needed for moderate pain or severe pain. 90 tablet 0  . rivaroxaban (XARELTO) 10 MG TABS tablet Take 1 tablet (10 mg total) by mouth daily with breakfast. Take Xarelto for two and a half more weeks, then discontinue Xarelto. Once the patient has completed the Xarelto, they may resume the 81 mg Aspirin. 20 tablet 0  . traMADol (ULTRAM) 50 MG tablet Take 1-2 tablets (50-100 mg total) by mouth every 6 (six) hours as needed (mild pain). 80 tablet 1   No facility-administered medications prior to visit.     Allergies:   Review of patient's allergies indicates no known allergies.   Social History   Social History  . Marital Status: Single    Spouse Name: N/A  . Number of Children: N/A  . Years of Education: N/A   Social History Main Topics  . Smoking status: Never Smoker   . Smokeless tobacco: Never Used  . Alcohol Use: No  . Drug Use: No  . Sexual Activity: Not on file   Other Topics Concern  . Not on file  Social History Narrative     Family History:  The patient's family history includes Other in her mother, other, other, and sister.   ROS:   Please see the history of present illness.    ROS All other systems reviewed and are negative.   PHYSICAL EXAM:   VS:  There were no vitals taken for this visit.   GEN: Well nourished, well developed, in no acute distress HEENT: normal Neck: no JVD, carotid bruits, or masses Cardiac: 2RRR; no murmurs, rubs, or gallops,no edema.  Intact distal pulses bilaterally.  Respiratory:  clear to auscultation bilaterally, normal work of breathing GI: soft, nontender, nondistended, + BS MS: no deformity or atrophy Skin: warm and dry, no rash Neuro:  Alert and Oriented x 3, Strength and sensation are intact Psych: euthymic  mood, full affect  Wt Readings from Last 3 Encounters:  03/19/15 224 lb (101.606 kg)  03/12/15 224 lb (101.606 kg)  01/16/14 205 lb (92.987 kg)      Studies/Labs Reviewed:   EKG:  EKG is  ordered today.  The ekg ordered today demonstrates ***  Recent Labs: 03/12/2015: ALT 17 03/20/2015: BUN 7; Creatinine, Ser 0.49; Hemoglobin 11.5*; Platelets 280; Potassium 3.5; Sodium 140   Lipid Panel No results found for: CHOL, TRIG, HDL, CHOLHDL, VLDL, LDLCALC, LDLDIRECT  Additional studies/ records that were reviewed today include:  OV notes from PCP    ASSESSMENT:    No diagnosis found.   PLAN:  In order of problems listed above:  1. ***    Medication Adjustments/Labs and Tests Ordered: Current medicines are reviewed at length with the patient today.  Concerns regarding medicines are outlined above.  Medication changes, Labs and Tests ordered today are listed in the Patient Instructions below. There are no Patient Instructions on file for this visit.   Harlon Flor, MD  08/04/2015 9:41 PM    [facility] [address] [place], [place], [place]  [address] Phone: [phone]; Fax: [phone]

## 2015-08-05 ENCOUNTER — Encounter: Payer: 59 | Admitting: Cardiology

## 2015-08-06 ENCOUNTER — Encounter: Payer: Self-pay | Admitting: Cardiology

## 2015-08-06 NOTE — Progress Notes (Signed)
This encounter was created in error - please disregard.

## 2015-08-19 ENCOUNTER — Encounter (HOSPITAL_COMMUNITY): Payer: Self-pay | Admitting: *Deleted

## 2015-08-19 ENCOUNTER — Ambulatory Visit (HOSPITAL_COMMUNITY)
Admission: EM | Admit: 2015-08-19 | Discharge: 2015-08-19 | Disposition: A | Discharge status: Home / Self Care | Payer: 59 | Attending: Family Medicine | Admitting: Family Medicine

## 2015-08-19 DIAGNOSIS — J302 Other seasonal allergic rhinitis: Secondary | ICD-10-CM | POA: Diagnosis not present

## 2015-08-19 MED ORDER — METHYLPREDNISOLONE ACETATE 80 MG/ML IJ SUSP
INTRAMUSCULAR | Status: AC
  Filled 2015-08-19: qty 1

## 2015-08-19 MED ORDER — METHYLPREDNISOLONE ACETATE 80 MG/ML IJ SUSP
80.0000 mg | Freq: Once | INTRAMUSCULAR | Status: AC
  Administered 2015-08-19: 80 mg via INTRAMUSCULAR

## 2015-08-19 MED ORDER — FEXOFENADINE HCL 180 MG PO TABS: 180.0000 mg | ORAL_TABLET | Freq: Every day | ORAL | Status: DC

## 2015-08-19 MED ORDER — FLUTICASONE PROPIONATE 50 MCG/ACT NA SUSP: 1.0000 | Freq: Two times a day (BID) | NASAL | Status: DC

## 2015-08-19 MED FILL — SM FEXOFENADINE 180 MG TAB: 180 | 30 days supply | Qty: 30 | Fill #0

## 2015-08-19 MED FILL — FLUTICASONE PROP 50 MCG SPR: 50 | 60 days supply | Qty: 16 | Fill #0

## 2015-08-19 NOTE — ED Notes (Signed)
Pt  Reports   Symptoms  Of  Cough   Congestion       X  3  Weeks       Stuffy  Nose  And  Ears   With  Onset  Of   Symptoms     X  3  Weeks

## 2015-08-19 NOTE — ED Provider Notes (Signed)
CSN: [id number]     Arrival date & time 08/19/15  1544 History   First MD Initiated Contact with Patient 08/19/15 1711     Chief Complaint  Patient presents with  . URI   (Consider location/radiation/quality/duration/timing/severity/associated sxs/prior Treatment) Patient is a 65 y.o. female presenting with URI. The history is provided by the patient.  URI Presenting symptoms: congestion and rhinorrhea   Presenting symptoms: no fever   Severity:  Mild Onset quality:  Gradual Duration:  3 weeks Timing:  Constant Progression:  Worsening Chronicity:  New Relieved by:  None tried Worsened by:  Nothing tried Ineffective treatments:  None tried Associated symptoms: sneezing     Past Medical History  Diagnosis Date  . Hypertension   . Hyperlipidemia   . Heart murmur     hx of   . Depression   . Arthritis   . PONV (postoperative nausea and vomiting)   . Anxiety   . Benign essential HTN 08/04/2015   Past Surgical History  Procedure Laterality Date  . Total hip arthroplasty Left 01/16/2014    Procedure: LEFT TOTAL HIP ARTHROPLASTY ANTERIOR APPROACH;  Surgeon: Loanne Drilling, MD;  Location: [facility];  Service: Orthopedics;  Laterality: Left;  . Total hip arthroplasty Right 03/19/2015    Procedure: TOTAL HIP ARTHROPLASTY ANTERIOR APPROACH;  Surgeon: Ollen Gross, MD;  Location: [facility];  Service: Orthopedics;  Laterality: Right;   Family History  Problem Relation Age of Onset  . Other Mother   . Other Sister   . Other Other   . Other Other    Social History  Substance Use Topics  . Smoking status: Never Smoker   . Smokeless tobacco: Never Used  . Alcohol Use: No   OB History    No data available     Review of Systems  Constitutional: Negative.  Negative for fever.  HENT: Positive for congestion, postnasal drip, rhinorrhea and sneezing.   Respiratory: Negative.   Cardiovascular: Negative.   Gastrointestinal: Negative.   All other systems reviewed and are  negative.   Allergies  Review of patient's allergies indicates no known allergies.  Home Medications   Prior to Admission medications   Medication Sig Start Date End Date Taking? Authorizing Provider  acetaminophen (TYLENOL) 500 MG tablet Take 500 mg by mouth every 6 (six) hours as needed.    Historical Provider, MD  atorvastatin (LIPITOR) 40 MG tablet Take 40 mg by mouth daily. Takes in the pm    Historical Provider, MD  diphenhydrAMINE (BENADRYL) 25 MG tablet Take 25 mg by mouth every 6 (six) hours as needed for allergies (for headaches).    Historical Provider, MD  escitalopram (LEXAPRO) 10 MG tablet Take 10 mg by mouth every evening.    Historical Provider, MD  felodipine (PLENDIL) 10 MG 24 hr tablet Take 10 mg by mouth daily. Takes in the evening    Historical Provider, MD  fexofenadine (ALLEGRA) 180 MG tablet Take 1 tablet (180 mg total) by mouth daily. 08/19/15   Linna Hoff, MD  fluticasone (FLONASE) 50 MCG/ACT nasal spray Place 1 spray into both nostrils 2 (two) times daily. 08/19/15   Linna Hoff, MD  lisinopril-hydrochlorothiazide (PRINZIDE,ZESTORETIC) 20-12.5 MG per tablet Take 1 tablet by mouth every evening.    Historical Provider, MD  methocarbamol (ROBAXIN) 500 MG tablet Take 1 tablet (500 mg total) by mouth every 6 (six) hours as needed for muscle spasms. 03/20/15   Avel Peace, PA-C  metoprolol succinate (TOPROL-XL) 100 MG  24 hr tablet Take 100 mg by mouth daily. Take with or immediately following a meal.  Takes in the pm    Historical Provider, MD  oxyCODONE (OXY IR/ROXICODONE) 5 MG immediate release tablet Take 1-2 tablets (5-10 mg total) by mouth every 3 (three) hours as needed for moderate pain or severe pain. 03/20/15   [name], PA-C  rivaroxaban (XARELTO) 10 MG TABS tablet Take 1 tablet (10 mg total) by mouth daily with breakfast. Take Xarelto for two and a half more weeks, then discontinue Xarelto. Once the patient has completed the Xarelto, they may resume the  81 mg Aspirin. 03/20/15   [name], PA-C  traMADol (ULTRAM) 50 MG tablet Take 1-2 tablets (50-100 mg total) by mouth every 6 (six) hours as needed (mild pain). 03/20/15   [name], PA-C   Meds Ordered and Administered this Visit   Medications  methylPREDNISolone acetate (DEPO-MEDROL) injection 80 mg (not administered)    BP 140/64 mmHg  Pulse 93  Temp(Src) 98.6 F (37 C) (Oral)  Resp 16  SpO2 100% No data found.   Physical Exam  Constitutional: She is oriented to person, place, and time. She appears well-developed and well-nourished. No distress.  HENT:  Right Ear: External ear normal.  Left Ear: External ear normal.  Nose: Mucosal edema and rhinorrhea present.  Mouth/Throat: Oropharynx is clear and moist.  Neck: Normal range of motion. Neck supple.  Cardiovascular: Normal rate, regular rhythm, normal heart sounds and intact distal pulses.   Pulmonary/Chest: Effort normal and breath sounds normal.  Lymphadenopathy:    She has no cervical adenopathy.  Neurological: She is alert and oriented to person, place, and time.  Skin: Skin is warm and dry.  Nursing note and vitals reviewed.   ED Course  Procedures (including critical care time)  Labs Review Labs Reviewed - No data to display  Imaging Review No results found.   Visual Acuity Review  Right Eye Distance:   Left Eye Distance:   Bilateral Distance:    Right Eye Near:   Left Eye Near:    Bilateral Near:         MDM   1. Seasonal allergic rhinitis        Linna HoffJames D Pastor Sgro, MD 08/19/15 1718

## 2015-10-05 DIAGNOSIS — H52223 Regular astigmatism, bilateral: Secondary | ICD-10-CM | POA: Diagnosis not present

## 2015-10-27 MED FILL — ESCITALOPRAM 10 MG TABLET: 10 | 90 days supply | Qty: 90 | Fill #0

## 2015-10-27 MED FILL — ATORVASTATIN 40 MG TABLET: 40 | 90 days supply | Qty: 90 | Fill #1

## 2015-12-04 MED FILL — FELODIPINE ER 10 MG TABLET: 10 | 90 days supply | Qty: 90 | Fill #0

## 2015-12-04 MED FILL — METOPROLOL SUCC ER 100 MG T: 100 | 90 days supply | Qty: 90 | Fill #0

## 2015-12-05 ENCOUNTER — Telehealth: Payer: Self-pay

## 2015-12-05 NOTE — Telephone Encounter (Signed)
PATIENT'S FMLA PAPERWORK READY AND WAS FAXED - ALSO SCANNED

## 2016-01-16 DIAGNOSIS — I10 Essential (primary) hypertension: Secondary | ICD-10-CM | POA: Diagnosis not present

## 2016-01-16 DIAGNOSIS — E785 Hyperlipidemia, unspecified: Secondary | ICD-10-CM | POA: Diagnosis not present

## 2016-01-16 DIAGNOSIS — F3341 Major depressive disorder, recurrent, in partial remission: Secondary | ICD-10-CM | POA: Diagnosis not present

## 2016-01-16 LAB — BASIC METABOLIC PANEL
BUN: 13 (ref 4–21)
Creatinine: 0.5 (ref 0.5–1.1)
Glucose: 81
Potassium: 3.7 (ref 3.4–5.3)
Sodium: 143 (ref 137–147)

## 2016-01-16 LAB — LIPID PANEL
Cholesterol: 159 (ref 0–200)
HDL: 62 (ref 35–70)
LDL Cholesterol: 87
Triglycerides: 50 (ref 40–160)

## 2016-01-16 LAB — CBC AND DIFFERENTIAL
HCT: 36 (ref 36–46)
Hemoglobin: 11.6 — AB (ref 12.0–16.0)
Platelets: 307 (ref 150–399)
WBC: 8.4

## 2016-01-16 LAB — HEPATIC FUNCTION PANEL
ALT: 15 (ref 7–35)
AST: 19 (ref 13–35)
Alkaline Phosphatase: 98 (ref 25–125)
Bilirubin, Total: 0.5

## 2016-01-16 LAB — TSH: TSH: 1 (ref 0.41–5.90)

## 2016-01-16 MED FILL — POTASSIUM CL ER 10 MEQ TAB: 10 | 30 days supply | Qty: 30 | Fill #0

## 2016-01-16 MED FILL — LISINOPRIL-HCTZ 20-25 MG TA: 20-25 | 90 days supply | Qty: 90 | Fill #0

## 2016-01-18 MED FILL — ATORVASTATIN 40 MG TABLET: 40 | 90 days supply | Qty: 90 | Fill #0

## 2016-01-18 MED FILL — ESCITALOPRAM 10 MG TABLET: 10 | 90 days supply | Qty: 90 | Fill #0

## 2016-04-16 MED FILL — LISINOPRIL-HCTZ 20-25 MG TA: 20-25 | 90 days supply | Qty: 90 | Fill #1

## 2016-04-16 MED FILL — FELODIPINE ER 10 MG TABLET: 10 | 90 days supply | Qty: 90 | Fill #0

## 2016-04-16 MED FILL — POTASSIUM CL 10 MEQ TAB SA: 10 | 30 days supply | Qty: 30 | Fill #1

## 2016-04-16 MED FILL — METOPROLOL SUCC ER 100 MG T: 100 | 90 days supply | Qty: 90 | Fill #0

## 2016-05-14 DIAGNOSIS — I10 Essential (primary) hypertension: Secondary | ICD-10-CM | POA: Diagnosis not present

## 2016-05-14 LAB — BASIC METABOLIC PANEL
BUN: 16 (ref 4–21)
Creatinine: 0.6 (ref 0.5–1.1)
Glucose: 84
Potassium: 4 (ref 3.4–5.3)
Sodium: 137 (ref 137–147)

## 2016-05-25 MED FILL — POTASSIUM CL 10 MEQ TAB SA: 10 | 30 days supply | Qty: 30 | Fill #2

## 2016-06-23 IMAGING — DX DG PORTABLE PELVIS
1 series · 1 of 1 positions shown · non-contrast
Comparison: 01/16/2014

CLINICAL DATA: Status post right hip replacement

EXAM:
PORTABLE PELVIS 1-2 VIEWS

[pelvis ap]
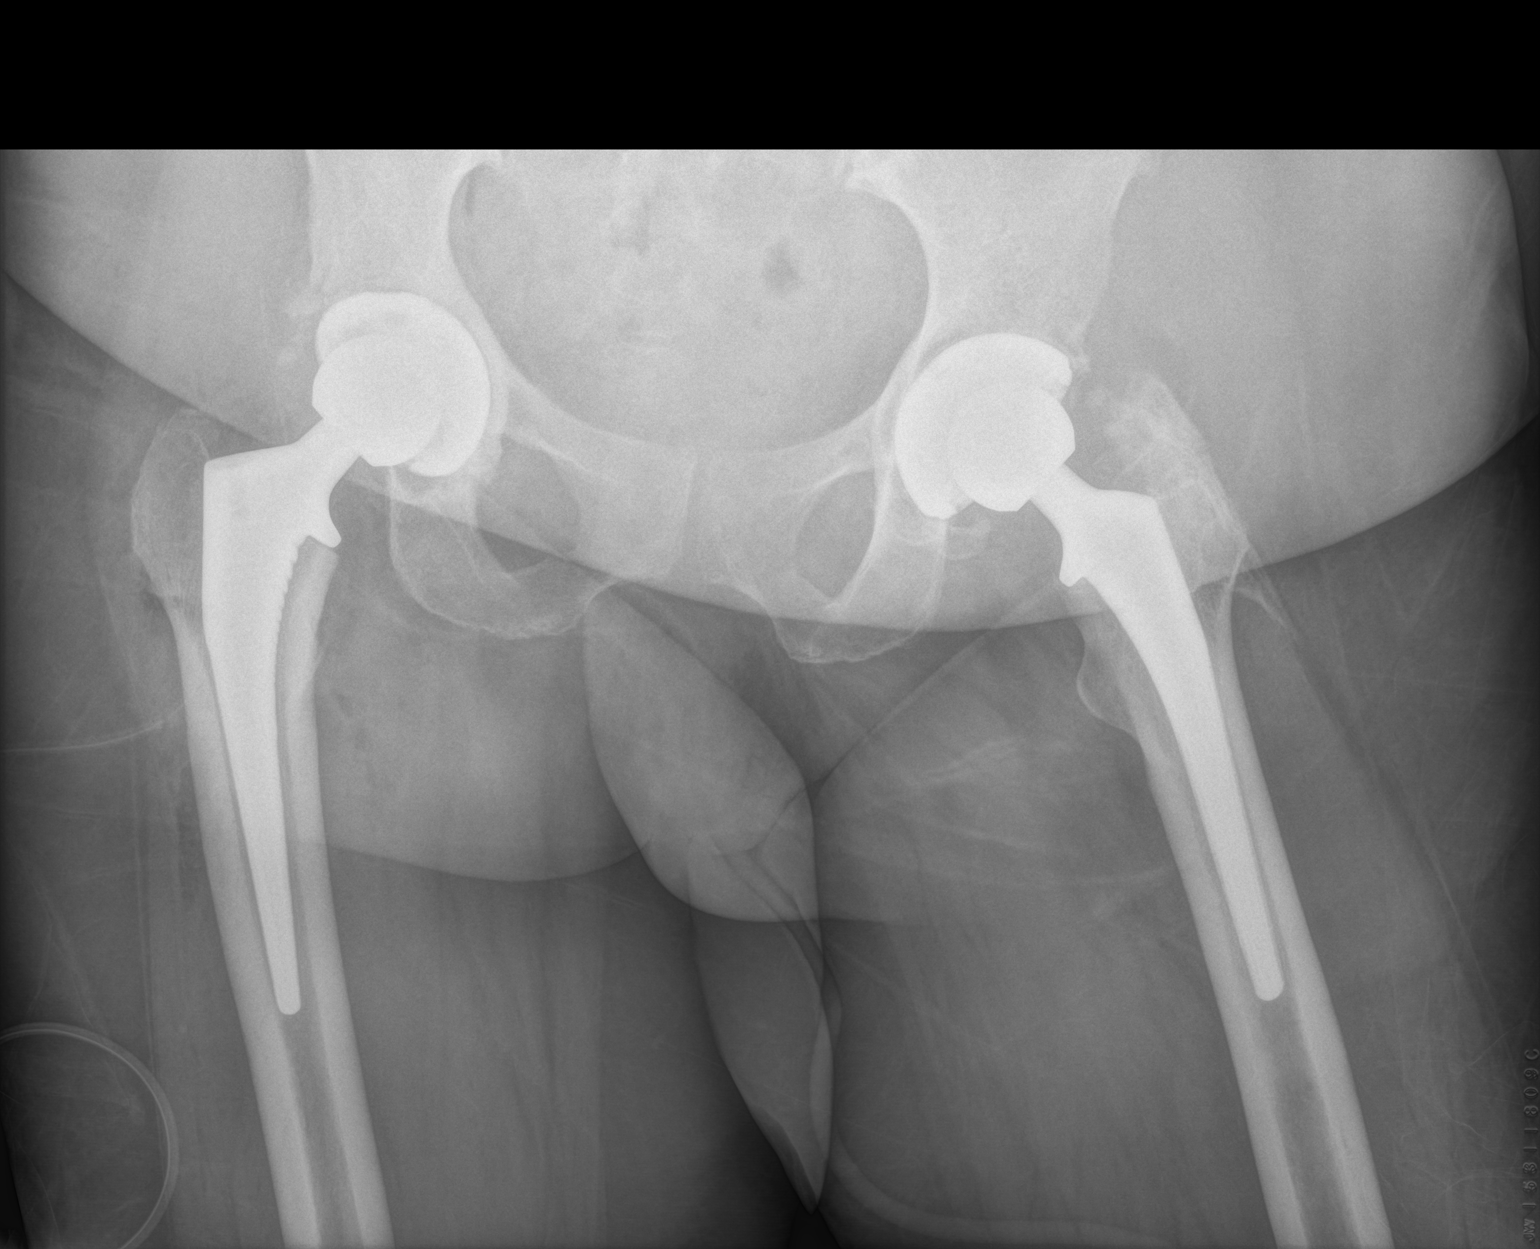

[1 of 1 positions shown; findings below may reference images not displayed]

FINDINGS: A right hip prosthesis is now seen. Previously placed left hip
prosthesis is again noted. No acute bony abnormality is seen. No
soft tissue changes are noted.
IMPRESSION: Status post right hip replacement no acute abnormality noted.

## 2016-07-05 MED FILL — POTASSIUM CL 10 MEQ TAB SA: 10 | 30 days supply | Qty: 30 | Fill #3

## 2016-07-05 MED FILL — ESCITALOPRAM 10 MG TABLET: 10 | 90 days supply | Qty: 90 | Fill #1

## 2016-07-28 MED FILL — ATORVASTATIN 40 MG TABLET: 40 | 90 days supply | Qty: 90 | Fill #1

## 2016-07-28 MED FILL — METOPROLOL SUCC ER 100 MG T: 100 | 90 days supply | Qty: 90 | Fill #1

## 2016-07-28 MED FILL — FELODIPINE ER 10 MG TABLET: 10 | 90 days supply | Qty: 90 | Fill #1

## 2016-07-28 MED FILL — LISINOPRIL-HCTZ 20-25 MG TA: 20-25 | 90 days supply | Qty: 90 | Fill #0

## 2016-07-29 MED FILL — POTASSIUM CL 10 MEQ TAB SA: 10 | 30 days supply | Qty: 30 | Fill #4

## 2016-09-23 MED FILL — POTASSIUM CL 10 MEQ TAB SA: 10 | 30 days supply | Qty: 30 | Fill #5

## 2016-10-29 MED FILL — ESCITALOPRAM 10 MG TABLET: 10 | 90 days supply | Qty: 90 | Fill #0

## 2016-10-29 MED FILL — POTASSIUM 25 MEQ TABLET EFF: 25 | 90 days supply | Qty: 90 | Fill #0

## 2016-11-12 DIAGNOSIS — I10 Essential (primary) hypertension: Secondary | ICD-10-CM | POA: Diagnosis not present

## 2016-11-12 DIAGNOSIS — F3341 Major depressive disorder, recurrent, in partial remission: Secondary | ICD-10-CM | POA: Diagnosis not present

## 2016-11-12 DIAGNOSIS — Z1211 Encounter for screening for malignant neoplasm of colon: Secondary | ICD-10-CM | POA: Diagnosis not present

## 2016-11-12 DIAGNOSIS — Z23 Encounter for immunization: Secondary | ICD-10-CM | POA: Diagnosis not present

## 2016-11-12 DIAGNOSIS — E785 Hyperlipidemia, unspecified: Secondary | ICD-10-CM | POA: Diagnosis not present

## 2016-12-01 MED FILL — METOPROLOL SUCC ER 100 MG T: 100 | 90 days supply | Qty: 90 | Fill #0

## 2016-12-01 MED FILL — FELODIPINE ER 10 MG TABLET: 10 | 90 days supply | Qty: 90 | Fill #0

## 2016-12-01 MED FILL — ATORVASTATIN 40 MG TABLET: 40 | 90 days supply | Qty: 90 | Fill #0

## 2016-12-16 ENCOUNTER — Encounter: Payer: Self-pay | Admitting: Gastroenterology

## 2017-01-06 MED FILL — LISINOPRIL-HCTZ 20-25 MG TA: 20-25 | 90 days supply | Qty: 90 | Fill #1

## 2017-02-01 ENCOUNTER — Ambulatory Visit (AMBULATORY_SURGERY_CENTER): Payer: Self-pay

## 2017-02-01 VITALS — Ht 64.0 in | Wt 245.0 lb

## 2017-02-01 DIAGNOSIS — Z1211 Encounter for screening for malignant neoplasm of colon: Secondary | ICD-10-CM

## 2017-02-01 MED ORDER — NA SULFATE-K SULFATE-MG SULF 17.5-3.13-1.6 GM/177ML PO SOLN: ORAL | 0 refills | Status: DC

## 2017-02-01 NOTE — Progress Notes (Signed)
Per pt, no allergies to soy or egg products.Pt not taking any weight loss meds or using  O2 at home.    Emmi video was sent to pt's e-mail.

## 2017-02-14 ENCOUNTER — Telehealth: Payer: Self-pay | Admitting: Gastroenterology

## 2017-02-15 ENCOUNTER — Encounter: Payer: 59 | Admitting: Gastroenterology

## 2017-02-18 MED FILL — ESCITALOPRAM 10 MG TABLET: 10 | 90 days supply | Qty: 90 | Fill #0 | Status: TO

## 2017-03-17 MED FILL — METOPROLOL SUCC ER 100 MG T: 100 | 90 days supply | Qty: 90 | Fill #0 | Status: TO

## 2017-03-17 MED FILL — ATORVASTATIN 40 MG TABLET: 40 | 90 days supply | Qty: 90 | Fill #0 | Status: TO

## 2017-03-17 MED FILL — FELODIPINE ER 10 MG TABLET: 10 | 90 days supply | Qty: 90 | Fill #0 | Status: TO

## 2017-04-28 MED FILL — LISINOPRIL-HCTZ 20-25 MG TA: 20-25 | 90 days supply | Qty: 90 | Fill #0 | Status: TO

## 2018-02-06 ENCOUNTER — Ambulatory Visit: Payer: Medicare Other | Admitting: Family Medicine

## 2018-02-22 ENCOUNTER — Other Ambulatory Visit: Payer: Self-pay

## 2018-02-22 ENCOUNTER — Encounter (HOSPITAL_COMMUNITY): Payer: Self-pay | Admitting: Family Medicine

## 2018-02-22 ENCOUNTER — Ambulatory Visit (HOSPITAL_COMMUNITY)
Admission: EM | Admit: 2018-02-22 | Discharge: 2018-02-22 | Disposition: A | Discharge status: Home / Self Care | Payer: Medicare PPO | Attending: Family Medicine | Admitting: Family Medicine

## 2018-02-22 DIAGNOSIS — I10 Essential (primary) hypertension: Secondary | ICD-10-CM

## 2018-02-22 DIAGNOSIS — F419 Anxiety disorder, unspecified: Secondary | ICD-10-CM

## 2018-02-22 MED ORDER — ESCITALOPRAM OXALATE 10 MG PO TABS: 10.0000 mg | ORAL_TABLET | Freq: Every evening | ORAL | 3 refills | Status: DC

## 2018-02-22 MED ORDER — ATORVASTATIN CALCIUM 40 MG PO TABS: 40.0000 mg | ORAL_TABLET | Freq: Every day | ORAL | 3 refills | Status: DC

## 2018-02-22 MED ORDER — LISINOPRIL-HYDROCHLOROTHIAZIDE 20-12.5 MG PO TABS: 1.0000 | ORAL_TABLET | Freq: Every evening | ORAL | 3 refills | Status: DC

## 2018-02-22 NOTE — ED Provider Notes (Signed)
[place]    CSN: [id number] Arrival date & time: 02/22/18  1006     History   Chief Complaint Chief Complaint  Patient presents with  . Medication Refill    HPI Tammy [name] is a 67 y.o. female.    Is a 67 year old woman who was presented to the [facility] urgent care for the second time in 3 years, this time needing a medication refill.  Not had any of her medicines except for lisinopril in 2 months.     Past Medical History:  Diagnosis Date  . Anxiety   . Arthritis   . Benign essential HTN 08/04/2015  . Depression   . Heart murmur    hx of   . Hyperlipidemia   . Hypertension   . PONV (postoperative nausea and vomiting)    1 time    Patient Active Problem List   Diagnosis Date Noted  . SOB (shortness of breath) 08/04/2015  . Benign essential HTN 08/04/2015  . Hyperlipidemia 08/04/2015  . Anxiety 08/04/2015  . Heart murmur 08/04/2015  . OA (osteoarthritis) of hip 01/16/2014    Past Surgical History:  Procedure Laterality Date  . 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 TOTAL HIP ARTHROPLASTY Right 03/19/2015   Procedure: TOTAL HIP ARTHROPLASTY ANTERIOR APPROACH;  Surgeon: Gaynelle Arabian, MD;  Location: [facility];  Service: Orthopedics;  Laterality: Right;    OB History   None      Home Medications    Prior to Admission medications   Medication Sig Start Date End Date Taking? Authorizing Provider  aspirin EC 81 MG tablet Take 81 mg by mouth daily.    [provider]  atorvastatin (LIPITOR) 40 MG tablet Take 1 tablet (40 mg total) by mouth daily. Takes in the pm 02/22/18   Robyn Haber, MD  diphenhydrAMINE (BENADRYL) 25 MG tablet Take 25 mg by mouth every 6 (six) hours as needed for allergies (for headaches).    [provider]  escitalopram (LEXAPRO) 10 MG tablet  Take 1 tablet (10 mg total) by mouth every evening. 02/22/18   Robyn Haber, MD  ibuprofen (ADVIL,MOTRIN) 200 MG tablet Take 200 mg by mouth. Take 4 pills prn    [provider]  lisinopril-hydrochlorothiazide (PRINZIDE,ZESTORETIC) 20-12.5 MG tablet Take 1 tablet by mouth every evening. 02/22/18   Robyn Haber, MD  Na Sulfate-K Sulfate-Mg Sulf (SUPREP BOWEL PREP KIT) 17.5-3.13-1.6 GM/180ML SOLN Suprep as directed / no substitutions 02/01/17   Danis, Kirke Corin, MD    Family History Family History  Problem Relation Age of Onset  . Other Mother   . Other Sister   . Diabetes Sister   . Hepatitis Sister   . Other Other   . Other Other     Social History Social History   Tobacco Use  . Smoking status: Never Smoker  . Smokeless tobacco: Never Used  Substance Use Topics  . Alcohol use: No  . Drug use: No     Allergies   Patient has no known allergies.   Review of Systems Review of Systems   Physical Exam Triage Vital Signs ED Triage Vitals [02/22/18 1100]  Enc Vitals Group     BP 100/76     Pulse Rate 77     Resp 18     Temp 98.2 F (36.8 C)  Temp Source Oral     SpO2 100 %     Weight      Height      Head Circumference      Peak Flow      Pain Score      Pain Loc      Pain Edu?      Excl. in [place]?    No data found.  Updated Vital Signs BP 100/76 (BP Location: Left Arm)   Pulse 77   Temp 98.2 F (36.8 C) (Oral)   Resp 18   SpO2 100%    Physical Exam  Constitutional: She is oriented to person, place, and time. She appears well-developed and well-nourished.  HENT:  Right Ear: External ear normal.  Left Ear: External ear normal.  Mouth/Throat: Oropharynx is clear and moist.  Eyes: Conjunctivae are normal.  Neck: Normal range of motion. Neck supple.  Cardiovascular: Normal rate, regular rhythm and normal heart sounds.  Pulmonary/Chest: Effort normal and breath sounds normal.  Musculoskeletal: Normal range of motion.  Neurological:  She is alert and oriented to person, place, and time.  Skin: Skin is warm and dry.  Psychiatric: She has a normal mood and affect. Her behavior is normal. Judgment and thought content normal.  Nursing note and vitals reviewed.    UC Treatments / Results  Labs (all labs ordered are listed, but only abnormal results are displayed) Labs Reviewed - No data to display  EKG None  Radiology No results found.  Procedures Procedures (including critical care time)  Medications Ordered in UC Medications - No data to display  Initial Impression / Assessment and Plan / UC Course  I have reviewed the triage vital signs and the nursing notes.  Pertinent labs & imaging results that were available during my care of the patient were reviewed by me and considered in my medical decision making (see chart for details).    Final Clinical Impressions(s) / UC Diagnoses   Final diagnoses:  Essential hypertension  Anxiety   Discharge Instructions   None    ED Prescriptions    Medication Sig Dispense Auth. Provider   escitalopram (LEXAPRO) 10 MG tablet Take 1 tablet (10 mg total) by mouth every evening. 90 tablet Robyn Haber, MD   atorvastatin (LIPITOR) 40 MG tablet Take 1 tablet (40 mg total) by mouth daily. Takes in the pm 90 tablet Robyn Haber, MD   lisinopril-hydrochlorothiazide (PRINZIDE,ZESTORETIC) 20-12.5 MG tablet Take 1 tablet by mouth every evening. 90 tablet Robyn Haber, MD     Controlled Substance Prescriptions [place] Controlled Substance Registry consulted? Not Applicable   Robyn Haber, MD 02/22/18 1121

## 2018-02-22 NOTE — ED Triage Notes (Signed)
Patient requesting medication refills:  Escitalopram, atorvastatin, felodipine, metoprolol, lisinopril-hctz

## 2018-07-22 ENCOUNTER — Ambulatory Visit (HOSPITAL_COMMUNITY)
Admission: EM | Admit: 2018-07-22 | Discharge: 2018-07-22 | Disposition: A | Discharge status: Home / Self Care | Payer: Medicare Other | Attending: Family Medicine | Admitting: Family Medicine

## 2018-07-22 ENCOUNTER — Encounter (HOSPITAL_COMMUNITY): Payer: Self-pay

## 2018-07-22 DIAGNOSIS — E785 Hyperlipidemia, unspecified: Secondary | ICD-10-CM

## 2018-07-22 DIAGNOSIS — I10 Essential (primary) hypertension: Secondary | ICD-10-CM | POA: Diagnosis not present

## 2018-07-22 DIAGNOSIS — F419 Anxiety disorder, unspecified: Secondary | ICD-10-CM

## 2018-07-22 MED ORDER — ESCITALOPRAM OXALATE 10 MG PO TABS: 10.0000 mg | ORAL_TABLET | Freq: Every evening | ORAL | 1 refills | Status: DC

## 2018-07-22 MED ORDER — LISINOPRIL-HYDROCHLOROTHIAZIDE 20-12.5 MG PO TABS
1.0000 | ORAL_TABLET | Freq: Every evening | ORAL | 1 refills | Status: DC
Start: 2018-07-22 — End: 2018-08-15

## 2018-07-22 MED ORDER — ATORVASTATIN CALCIUM 40 MG PO TABS: 40.0000 mg | ORAL_TABLET | Freq: Every day | ORAL | 1 refills | Status: DC

## 2018-07-22 NOTE — Discharge Instructions (Addendum)
You are welcome to return here within the next week to recheck your blood pressure. Keep your appointment with your new primary care provider on August 04, 2018.

## 2018-07-22 NOTE — ED Triage Notes (Signed)
Pt is here for a medication refill: Atorvastatin, Lisinopril-hydrochlorothiazide and Lexapro.

## 2018-07-22 NOTE — ED Provider Notes (Signed)
[facility]   [id number] 07/22/18 Arrival Time: 65  ASSESSMENT & PLAN:  1. Essential hypertension   2. Anxiety   3. Hyperlipidemia, unspecified hyperlipidemia type     Meds ordered this encounter  Medications  . atorvastatin (LIPITOR) 40 MG tablet    Sig: Take 1 tablet (40 mg total) by mouth daily. Takes in the pm    Dispense:  30 tablet    Refill:  1  . escitalopram (LEXAPRO) 10 MG tablet    Sig: Take 1 tablet (10 mg total) by mouth every evening.    Dispense:  30 tablet    Refill:  1  . lisinopril-hydrochlorothiazide (PRINZIDE,ZESTORETIC) 20-12.5 MG tablet    Sig: Take 1 tablet by mouth every evening.    Dispense:  30 tablet    Refill:  1     Discharge Instructions     You are welcome to return here within the next week to recheck your blood pressure. Keep your appointment with your new primary care provider on August 04, 2018.     Reviewed expectations re: course of current medical issues. Questions answered. Outlined signs and symptoms indicating need for more acute intervention. Patient verbalized understanding. After Visit Summary given.   SUBJECTIVE: History from: patient. Tammy [name] is a 68 y.o. female who presents requesting medication refill. No current concerns other than being out of her HTN, anxiety, and cholesterol medication for the past three days. Occasional and mild headaches that she relates to increased BP. Typically HTN is controlled when taking her medications. No visual changes. No extremity sensation changes or weakness. Ambulatory without difficulty. Is more anxious now with COVID-19 news cycle.  Current medical problems include: Past Medical History:  Diagnosis Date  . Anxiety   . Arthritis   . Benign essential HTN 08/04/2015  . Depression   . Heart murmur    hx of   . Hyperlipidemia   . Hypertension   . PONV (postoperative nausea and vomiting)    1 time    No current facility-administered medications for this  encounter.   Current Outpatient Medications:  .  aspirin EC 81 MG tablet, Take 81 mg by mouth daily., Disp: , Rfl:  .  atorvastatin (LIPITOR) 40 MG tablet, Take 1 tablet (40 mg total) by mouth daily. Takes in the pm, Disp: 30 tablet, Rfl: 1 .  diphenhydrAMINE (BENADRYL) 25 MG tablet, Take 25 mg by mouth every 6 (six) hours as needed for allergies (for headaches)., Disp: , Rfl:  .  escitalopram (LEXAPRO) 10 MG tablet, Take 1 tablet (10 mg total) by mouth every evening., Disp: 30 tablet, Rfl: 1 .  ibuprofen (ADVIL,MOTRIN) 200 MG tablet, Take 200 mg by mouth. Take 4 pills prn, Disp: , Rfl:  .  lisinopril-hydrochlorothiazide (PRINZIDE,ZESTORETIC) 20-12.5 MG tablet, Take 1 tablet by mouth every evening., Disp: 30 tablet, Rfl: 1 .  Na Sulfate-K Sulfate-Mg Sulf (SUPREP BOWEL PREP KIT) 17.5-3.13-1.6 GM/180ML SOLN, Suprep as directed / no substitutions, Disp: 354 mL, Rfl: 0  For Reqested Medication Refill: Reason for request: has appt with new PCP on 3 April Medications taken before: yes - see home medications   Patient has complete original prescription information: Yes   ROS: As per HPI. All other systems negative.    OBJECTIVE:  Vitals:   07/22/18 1034  BP: (!) 224/125  Pulse: 89  Resp: 18  Temp: 98.1 F (36.7 C)  TempSrc: Oral  SpO2: 99%    Elevated BP noted. Discussed with her.  General appearance: alert; no distress Eyes: PERRLA; EOMI; conjunctiva normal HENT: normocephalic; atraumatic Neck: supple  Lungs: clear to auscultation bilaterally Heart: regular rate and rhythm Back: no CVA tenderness Extremities: no cyanosis or edema; symmetrical Skin: warm and dry Neurologic: normal gait Psychological: alert and cooperative; normal mood and affect  Labs Reviewed: Results for orders placed or performed during the hospital encounter of 03/19/15  CBC  Result Value Ref Range   WBC 14.5 (H) 4.0 - 10.5 K/uL   RBC 3.92 3.87 - 5.11 MIL/uL   Hemoglobin 11.5 (L) 12.0 - 15.0 g/dL    HCT 35.5 (L) 36.0 - 46.0 %   MCV 90.6 78.0 - 100.0 fL   MCH 29.3 26.0 - 34.0 pg   MCHC 32.4 30.0 - 36.0 g/dL   RDW 14.3 11.5 - 15.5 %   Platelets 280 150 - 400 K/uL  Basic metabolic panel  Result Value Ref Range   Sodium 140 135 - 145 mmol/L   Potassium 3.5 3.5 - 5.1 mmol/L   Chloride 105 101 - 111 mmol/L   CO2 27 22 - 32 mmol/L   Glucose, Bld 136 (H) 65 - 99 mg/dL   BUN 7 6 - 20 mg/dL   Creatinine, Ser 0.49 0.44 - 1.00 mg/dL   Calcium 8.8 (L) 8.9 - 10.3 mg/dL   GFR calc non Af Amer >60 >60 mL/min   GFR calc Af Amer >60 >60 mL/min   Anion gap 8 5 - 15   No Known Allergies  Social History   Socioeconomic History  . Marital status: Single    Spouse name: Not on file  . Number of children: Not on file  . Years of education: Not on file  . Highest education level: Not on file  Occupational History  . Not on file  Social Needs  . Financial resource strain: Not on file  . Food insecurity:    Worry: Not on file    Inability: Not on file  . Transportation needs:    Medical: Not on file    Non-medical: Not on file  Tobacco Use  . Smoking status: Never Smoker  . Smokeless tobacco: Never Used  Substance and Sexual Activity  . Alcohol use: No  . Drug use: No  . Sexual activity: Not on file  Lifestyle  . Physical activity:    Days per week: Not on file    Minutes per session: Not on file  . Stress: Not on file  Relationships  . Social connections:    Talks on phone: Not on file    Gets together: Not on file    Attends religious service: Not on file    Active member of club or organization: Not on file    Attends meetings of clubs or organizations: Not on file    Relationship status: Not on file  . Intimate partner violence:    Fear of current or ex partner: Not on file    Emotionally abused: Not on file    Physically abused: Not on file    Forced sexual activity: Not on file  Other Topics Concern  . Not on file  Social History Narrative  . Not on file   Family  History  Problem Relation Age of Onset  . Other Mother   . Other Sister   . Diabetes Sister   . Hepatitis Sister   . Other Other   . Other Other    Past Surgical History:  Procedure Laterality Date  .  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 TOTAL HIP ARTHROPLASTY Right 03/19/2015   Procedure: TOTAL HIP ARTHROPLASTY ANTERIOR APPROACH;  Surgeon: Gaynelle Arabian, MD;  Location: [facility];  Service: Orthopedics;  Laterality: Right;     Vanessa Kick, MD 07/22/18 1059

## 2018-08-04 ENCOUNTER — Ambulatory Visit: Payer: 59 | Admitting: Nurse Practitioner

## 2018-08-10 ENCOUNTER — Telehealth: Payer: Self-pay

## 2018-08-10 NOTE — Telephone Encounter (Addendum)
I called patient to offer a tele-visit for her pending new patient appointment on Tuesday 08/15/2018. Patient consented to Tele-visit at first. After a moment of thinking about her current situation patient states she really needs to be seen in office due to elevated B/P concerns (see urgent care encounter from 07/22/2018).   Patient does not have access to a blood pressure machine and is unable to purchase one at this time. Please advise if patient to be seen in office Tuesday  Thanks, CB  Message routed to pending PCP- Shanda Bumps K. Janyth Contes, NP

## 2018-08-14 NOTE — Telephone Encounter (Signed)
Called patient, no answer, recording stated not able to accept calls at this time.

## 2018-08-14 NOTE — Telephone Encounter (Signed)
Called patient again. Recording still states unable to accept calls at this time.

## 2018-08-14 NOTE — Telephone Encounter (Signed)
Another attempt made to contact patient. Unable to reach patient, recording stated patient is unable to accept calls.   I called patient's daughter and she will have her mother call us once she gets home to give her the message

## 2018-08-14 NOTE — Telephone Encounter (Signed)
Recommend her getting a blood pressure cuff, if she is having problems with her blood pressure it is good practice to monitor at home. Maybe we can send a Rx for blood pressure cuff. We will discuss all this via tele-visit if needed but I would keep the visit as a tele-visit for now.

## 2018-08-15 ENCOUNTER — Ambulatory Visit (INDEPENDENT_AMBULATORY_CARE_PROVIDER_SITE_OTHER): Payer: Medicare Other | Admitting: Nurse Practitioner

## 2018-08-15 ENCOUNTER — Encounter: Payer: Self-pay | Admitting: Nurse Practitioner

## 2018-08-15 ENCOUNTER — Other Ambulatory Visit: Payer: Self-pay

## 2018-08-15 DIAGNOSIS — F325 Major depressive disorder, single episode, in full remission: Secondary | ICD-10-CM

## 2018-08-15 DIAGNOSIS — F419 Anxiety disorder, unspecified: Secondary | ICD-10-CM

## 2018-08-15 DIAGNOSIS — E782 Mixed hyperlipidemia: Secondary | ICD-10-CM

## 2018-08-15 DIAGNOSIS — M150 Primary generalized (osteo)arthritis: Secondary | ICD-10-CM

## 2018-08-15 DIAGNOSIS — M159 Polyosteoarthritis, unspecified: Secondary | ICD-10-CM

## 2018-08-15 DIAGNOSIS — I10 Essential (primary) hypertension: Secondary | ICD-10-CM

## 2018-08-15 DIAGNOSIS — J302 Other seasonal allergic rhinitis: Secondary | ICD-10-CM

## 2018-08-15 MED ORDER — BLOOD PRESSURE CUFF MISC: 0 refills | Status: DC

## 2018-08-15 MED ORDER — LISINOPRIL-HYDROCHLOROTHIAZIDE 20-12.5 MG PO TABS: 1.0000 | ORAL_TABLET | Freq: Every evening | ORAL | 0 refills | Status: DC

## 2018-08-15 MED ORDER — ESCITALOPRAM OXALATE 10 MG PO TABS: 10.0000 mg | ORAL_TABLET | Freq: Every evening | ORAL | 0 refills | Status: DC

## 2018-08-15 MED ORDER — ATORVASTATIN CALCIUM 40 MG PO TABS: 40.0000 mg | ORAL_TABLET | Freq: Every day | ORAL | 0 refills | Status: DC

## 2018-08-15 NOTE — Progress Notes (Signed)
This service is provided via telemedicine  No vital signs collected/recorded due to the encounter was a telemedicine visit.   Location of patient (ex: home, work):  Home  Patient consents to a telephone visit:  Yes  Location of the provider (ex: office, home):  [employer], Office   Names of all persons participating in the telemedicine service and their role in the encounter:  S.Chrae B/CMA, Abbey Chatters, NP, and Patient   Time spent on call:  10 min   Virtual Visit via Telephone Note  I connected with Tammy [name] on 08/15/18 at  1:30 PM EDT by telephone and verified that I am speaking with the correct person using two identifiers.   I discussed the limitations, risks, security and privacy concerns of performing an evaluation and management service by telephone and the availability of in person appointments. I also discussed with the patient that there may be a patient responsible charge related to this service. The patient expressed understanding and agreed to proceed.      Careteam: Patient Care Team: Patient, No Pcp Per as PCP - General (General Practice)  Advanced Directive information    No Known Allergies  Chief Complaint  Patient presents with  . Establish Care    New patient establish care. Patient c/o blood pressure issues. Patient c/o feet/ankle swelling and arthritis in hands      HPI: Patient is a 68 y.o. female to establish care.  Had been going to [facility] for years once she retired and insurance changed. Has not had consistent care since 2018. No routine blood work.   OA- bilateral total hip replacements in 2015/2016hands- right middle finger will get "stuck" and she has to put it out. Occasionally aches but no significant pain more irritated. 4/10  Occasional headache- will just tylenol 500 mg by mouth as needed- was off blood pressure medication.   Hyperlipidemia- lipitor 40 mg by mouth daily.   Seasonal allergies- uses  cetirizine 10 mg by mouth daily as needed, currently not having an issue  Depression- controlled (in remission) on lexapro 10 mg daily, does better with medication.   Anxiety- occassional. lexapro helps  Hypertension- taking lisinopril-hctz 20-12.5. previously on metoprolol 100 mg and amlodipine which she had been on for years. Previous bp was elevated when she went to urgent care but was out of all medication. She received a refill on lisinopril hctz 20-12.5 but has not taken blood pressure since.    Review of Systems:  Review of Systems  Constitutional: Negative for chills, fever and weight loss.  HENT: Negative for tinnitus.   Respiratory: Negative for cough, sputum production and shortness of breath.   Cardiovascular: Negative for chest pain, palpitations and leg swelling.       Ankles swelling  Gastrointestinal: Negative for abdominal pain, constipation, diarrhea and heartburn.  Genitourinary: Negative for dysuria, frequency and urgency.  Musculoskeletal: Negative for back pain, falls, joint pain and myalgias.  Skin: Negative.   Neurological: Negative for dizziness and headaches.  Endo/Heme/Allergies: Positive for environmental allergies.  Psychiatric/Behavioral: Positive for depression. Negative for memory loss. The patient is nervous/anxious. The patient does not have insomnia.        Controlled on medication    Past Medical History:  Diagnosis Date  . Anxiety   . Arthritis   . Benign essential HTN 08/04/2015  . Depression   . Heart murmur    hx of   . Hyperlipidemia   . Hypertension   . PONV (  postoperative nausea and vomiting)    1 time   Past Surgical History:  Procedure Laterality Date  . 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 TOTAL HIP ARTHROPLASTY Right 03/19/2015   Procedure: TOTAL HIP ARTHROPLASTY ANTERIOR  APPROACH;  Surgeon: Ollen GrossFrank Aluisio, MD;  Location: [facility];  Service: Orthopedics;  Laterality: Right;   Social History:   reports that she has never smoked. She has never used smokeless tobacco. She reports that she does not drink alcohol or use drugs.  Family History  Problem Relation Age of Onset  . Cancer Mother 1172       pancreatic  . Other Sister   . Diabetes Sister   . Hepatitis Sister        Hep C  . Stroke Sister        x 3  . High blood pressure Sister   . Other Other   . Other Other     Medications: Patient's Medications  New Prescriptions   No medications on file  Previous Medications   ACETAMINOPHEN (TYLENOL) 500 MG TABLET    Take 500 mg by mouth every 6 (six) hours as needed.   ATORVASTATIN (LIPITOR) 40 MG TABLET    Take 1 tablet (40 mg total) by mouth daily. Takes in the pm   CETIRIZINE HCL 10 MG CAPS    Take by mouth as needed.   ESCITALOPRAM (LEXAPRO) 10 MG TABLET    Take 1 tablet (10 mg total) by mouth every evening.   LISINOPRIL-HYDROCHLOROTHIAZIDE (PRINZIDE,ZESTORETIC) 20-12.5 MG TABLET    Take 1 tablet by mouth every evening.   PHENYLEPHRINE (EQ SUPHEDRINE PE) 10 MG TABS TABLET    Take 10 mg by mouth every 4 (four) hours as needed.  Modified Medications   No medications on file  Discontinued Medications   ASPIRIN EC 81 MG TABLET    Take 81 mg by mouth daily.     Physical Exam: Unable due to televisit  Labs reviewed: Basic Metabolic Panel: No results for input(s): NA, K, CL, CO2, GLUCOSE, BUN, CREATININE, CALCIUM, MG, PHOS, TSH in the last 8760 hours. Liver Function Tests: No results for input(s): AST, ALT, ALKPHOS, BILITOT, PROT, ALBUMIN in the last 8760 hours. No results for input(s): LIPASE, AMYLASE in the last 8760 hours. No results for input(s): AMMONIA in the last 8760 hours. CBC: No results for input(s): WBC, NEUTROABS, HGB, HCT, MCV, PLT in the last 8760 hours. Lipid Panel: No results for input(s): CHOL, HDL, LDLCALC, TRIG, CHOLHDL,  LDLDIRECT in the last 8760 hours. TSH: No results for input(s): TSH in the last 8760 hours. A1C: No results found for: HGBA1C   Assessment/Plan 1. Mixed hyperlipidemia -diet modifications encouraged.  - atorvastatin (LIPITOR) 40 MG tablet; Take 1 tablet (40 mg total) by mouth daily. Takes in the pm  Dispense: 30 tablet; Refill: 0 - COMPLETE METABOLIC PANEL WITH GFR; Future - Lipid panel; Future  2. Anxiety Controlled on lexapro, will continue current regimen - escitalopram (LEXAPRO) 10 MG tablet; Take 1 tablet (10 mg total) by mouth every evening.  Dispense: 30 tablet; Refill: 0  3. Depression, major, single episode, complete remission (HCC) Stable on current regimen.  - escitalopram (LEXAPRO) 10 MG tablet; Take 1 tablet (10 mg total) by mouth every evening.  Dispense: 30 tablet; Refill: 0  4. Primary osteoarthritis involving multiple joints Can use tylenol  500 mg by mouth 1-2 tablets as needed pain. Reports trigger finger but does not wish to have referral at this time.  5. Essential hypertension -dietary modification encouraged. To monitor bp at home.  - lisinopril-hydrochlorothiazide (PRINZIDE,ZESTORETIC) 20-12.5 MG tablet; Take 1 tablet by mouth every evening.  Dispense: 30 tablet; Refill: 0 - Blood Pressure Monitoring (BLOOD PRESSURE CUFF) MISC; Check blood pressure daily 1 hour following b/p medication  Dispense: 1 each; Refill: 0 - COMPLETE METABOLIC PANEL WITH GFR; Future - CBC with Differential/Platelet; Future  6 Seasonal allergies -to avoid phenylephrine due to elevated blood pressure, to continue cetirizine as needed   Next appt: 6 weeks  Tammy [name] K. Biagio Borg  [facility] [phone]   Follow Up Instructions:    I discussed the assessment and treatment plan with the patient. The patient was provided an opportunity to ask questions and all were answered. The patient agreed with the plan and demonstrated an understanding of the  instructions.   The patient was advised to call back or seek an in-person evaluation if the symptoms worsen or if the condition fails to improve as anticipated.  I provided 25 minutes of non-face-to-face time during this encounter.  avs printed and mailed [name], NP

## 2018-08-15 NOTE — Telephone Encounter (Signed)
Spoke with patient, patient states she can not afford to get B/P cuff. Patient ok for Tele-visit and requested that I call her after 12 pm

## 2018-08-15 NOTE — Patient Instructions (Addendum)
STOP phenylephrine- this will make your blood pressure high proper hydration and to avoid NSAIDS (Aleve, Advil, Motrin, Ibuprofen)  Diet modifications encouraged to help promote healthy lifestyle and blood pressure control.  To get blood pressure cuff and monitor blood pressure at home 3 times a week- take blood pressure ~1 hour after you have taken medication. Make sure you have been sitting for about 5 mins when you take blood pressure.   DASH Eating Plan DASH stands for "Dietary Approaches to Stop Hypertension." The DASH eating plan is a healthy eating plan that has been shown to reduce high blood pressure (hypertension). It may also reduce your risk for type 2 diabetes, heart disease, and stroke. The DASH eating plan may also help with weight loss. What are tips for following this plan?  General guidelines  Avoid eating more than 2,300 mg (milligrams) of salt (sodium) a day. If you have hypertension, you may need to reduce your sodium intake to 1,500 mg a day.  Limit alcohol intake to no more than 1 drink a day for nonpregnant women and 2 drinks a day for men. One drink equals 12 oz of beer, 5 oz of wine, or 1 oz of hard liquor.  Work with your health care provider to maintain a healthy body weight or to lose weight. Ask what an ideal weight is for you.  Get at least 30 minutes of exercise that causes your heart to beat faster (aerobic exercise) most days of the week. Activities may include walking, swimming, or biking.  Work with your health care provider or diet and nutrition specialist (dietitian) to adjust your eating plan to your individual calorie needs. Reading food labels   Check food labels for the amount of sodium per serving. Choose foods with less than 5 percent of the Daily Value of sodium. Generally, foods with less than 300 mg of sodium per serving fit into this eating plan.  To find whole grains, look for the word "whole" as the first word in the ingredient  list. Shopping  Buy products labeled as "low-sodium" or "no salt added."  Buy fresh foods. Avoid canned foods and premade or frozen meals. Cooking  Avoid adding salt when cooking. Use salt-free seasonings or herbs instead of table salt or sea salt. Check with your health care provider or pharmacist before using salt substitutes.  Do not fry foods. Cook foods using healthy methods such as baking, boiling, grilling, and broiling instead.  Cook with heart-healthy oils, such as olive, canola, soybean, or sunflower oil. Meal planning  Eat a balanced diet that includes: ? 5 or more servings of fruits and vegetables each day. At each meal, try to fill half of your plate with fruits and vegetables. ? Up to 6-8 servings of whole grains each day. ? Less than 6 oz of lean meat, poultry, or fish each day. A 3-oz serving of meat is about the same size as a deck of cards. One egg equals 1 oz. ? 2 servings of low-fat dairy each day. ? A serving of nuts, seeds, or beans 5 times each week. ? Heart-healthy fats. Healthy fats called Omega-3 fatty acids are found in foods such as flaxseeds and coldwater fish, like sardines, salmon, and mackerel.  Limit how much you eat of the following: ? Canned or prepackaged foods. ? Food that is high in trans fat, such as fried foods. ? Food that is high in saturated fat, such as fatty meat. ? Sweets, desserts, sugary drinks, and other foods  with added sugar. ? Full-fat dairy products.  Do not salt foods before eating.  Try to eat at least 2 vegetarian meals each week.  Eat more home-cooked food and less restaurant, buffet, and fast food.  When eating at a restaurant, ask that your food be prepared with less salt or no salt, if possible. What foods are recommended? The items listed may not be a complete list. Talk with your dietitian about what dietary choices are best for you. Grains Whole-grain or whole-wheat bread. Whole-grain or whole-wheat pasta. Brown  rice. [name]. Bulgur. Whole-grain and low-sodium cereals. Pita bread. Low-fat, low-sodium crackers. Whole-wheat flour tortillas. Vegetables Fresh or frozen vegetables (raw, steamed, roasted, or grilled). Low-sodium or reduced-sodium tomato and vegetable juice. Low-sodium or reduced-sodium tomato sauce and tomato paste. Low-sodium or reduced-sodium canned vegetables. Fruits All fresh, dried, or frozen fruit. Canned fruit in natural juice (without added sugar). Meat and other protein foods Skinless chicken or [place]. Ground chicken or [place]. Pork with fat trimmed off. Fish and seafood. Egg whites. Dried beans, peas, or lentils. Unsalted nuts, nut butters, and seeds. Unsalted canned beans. Lean cuts of beef with fat trimmed off. Low-sodium, lean deli meat. Dairy Low-fat (1%) or fat-free (skim) milk. Fat-free, low-fat, or reduced-fat cheeses. Nonfat, low-sodium ricotta or cottage cheese. Low-fat or nonfat yogurt. Low-fat, low-sodium cheese. Fats and oils Soft margarine without trans fats. Vegetable oil. Low-fat, reduced-fat, or light mayonnaise and salad dressings (reduced-sodium). Canola, safflower, olive, soybean, and sunflower oils. Avocado. Seasoning and other foods Herbs. Spices. Seasoning mixes without salt. Unsalted popcorn and pretzels. Fat-free sweets. What foods are not recommended? The items listed may not be a complete list. Talk with your dietitian about what dietary choices are best for you. Grains Baked goods made with fat, such as croissants, muffins, or some breads. Dry pasta or rice meal packs. Vegetables Creamed or fried vegetables. Vegetables in a cheese sauce. Regular canned vegetables (not low-sodium or reduced-sodium). Regular canned tomato sauce and paste (not low-sodium or reduced-sodium). Regular tomato and vegetable juice (not low-sodium or reduced-sodium). [name]. Olives. Fruits Canned fruit in a light or heavy syrup. Fried fruit. Fruit in cream or butter  sauce. Meat and other protein foods Fatty cuts of meat. Ribs. Fried meat. [name]. Sausage. Bologna and other processed lunch meats. Salami. Fatback. Hotdogs. Bratwurst. Salted nuts and seeds. Canned beans with added salt. Canned or smoked fish. Whole eggs or egg yolks. Chicken or [place] with skin. Dairy Whole or 2% milk, cream, and half-and-half. Whole or full-fat cream cheese. Whole-fat or sweetened yogurt. Full-fat cheese. Nondairy creamers. Whipped toppings. Processed cheese and cheese spreads. Fats and oils Butter. Stick margarine. Lard. Shortening. Ghee. Bacon fat. Tropical oils, such as coconut, palm kernel, or palm oil. Seasoning and other foods Salted popcorn and pretzels. Onion salt, garlic salt, seasoned salt, table salt, and sea salt. Worcestershire sauce. Tartar sauce. Barbecue sauce. Teriyaki sauce. Soy sauce, including reduced-sodium. Steak sauce. Canned and packaged gravies. Fish sauce. Oyster sauce. Cocktail sauce. Horseradish that you find on the shelf. Ketchup. Mustard. Meat flavorings and tenderizers. Bouillon cubes. Hot sauce and Tabasco sauce. Premade or packaged marinades. Premade or packaged taco seasonings. Relishes. Regular salad dressings. Where to find more information:  National Heart, Lung, and [place]: https://wilson-eaton.com/  American Heart Association: www.heart.org Summary  The DASH eating plan is a healthy eating plan that has been shown to reduce high blood pressure (hypertension). It may also reduce your risk for type 2 diabetes, heart disease, and stroke.  With the  DASH eating plan, you should limit salt (sodium) intake to 2,300 mg a day. If you have hypertension, you may need to reduce your sodium intake to 1,500 mg a day.  When on the DASH eating plan, aim to eat more fresh fruits and vegetables, whole grains, lean proteins, low-fat dairy, and heart-healthy fats.  Work with your health care provider or diet and nutrition specialist (dietitian) to adjust  your eating plan to your individual calorie needs. This information is not intended to replace advice given to you by your health care provider. Make sure you discuss any questions you have with your health care provider. Document Released: 04/08/2011 Document Revised: 04/12/2016 Document Reviewed: 04/12/2016 Elsevier Interactive Patient Education  2019 [employer]

## 2018-08-15 NOTE — Telephone Encounter (Signed)
Left message on voicemail for patient to return call when available   

## 2018-08-18 ENCOUNTER — Other Ambulatory Visit: Payer: Medicare Other

## 2018-08-18 ENCOUNTER — Other Ambulatory Visit: Payer: Self-pay

## 2018-08-18 DIAGNOSIS — E782 Mixed hyperlipidemia: Secondary | ICD-10-CM

## 2018-08-18 DIAGNOSIS — I10 Essential (primary) hypertension: Secondary | ICD-10-CM

## 2018-08-19 LAB — COMPLETE METABOLIC PANEL WITH GFR
AG Ratio: 1.4 (calc) (ref 1.0–2.5)
ALT: 17 U/L (ref 6–29)
AST: 23 U/L (ref 10–35)
Albumin: 4.1 g/dL (ref 3.6–5.1)
Alkaline phosphatase (APISO): 109 U/L (ref 37–153)
BUN: 14 mg/dL (ref 7–25)
CO2: 28 mmol/L (ref 20–32)
Calcium: 9.5 mg/dL (ref 8.6–10.4)
Chloride: 104 mmol/L (ref 98–110)
Creat: 0.67 mg/dL (ref 0.50–0.99)
GFR, Est African American: 106 mL/min/{1.73_m2} (ref >=60)
GFR, Est Non African American: 92 mL/min/{1.73_m2} (ref >=60)
Globulin: 3 g/dL (calc) (ref 1.9–3.7)
Glucose, Bld: 85 mg/dL (ref 65–139)
Potassium: 4.1 mmol/L (ref 3.5–5.3)
Sodium: 140 mmol/L (ref 135–146)
Total Bilirubin: 0.6 mg/dL (ref 0.2–1.2)
Total Protein: 7.1 g/dL (ref 6.1–8.1)

## 2018-08-19 LAB — LIPID PANEL
Cholesterol: 164 mg/dL (ref <200)
HDL: 66 mg/dL (ref >=50)
LDL Cholesterol (Calc): 83 mg/dL (calc)
Non-HDL Cholesterol (Calc): 98 mg/dL (calc) (ref <130)
Total CHOL/HDL Ratio: 2.5 (calc) (ref <5.0)
Triglycerides: 72 mg/dL (ref <150)

## 2018-08-19 LAB — CBC WITH DIFFERENTIAL/PLATELET
Absolute Monocytes: 592 cells/uL (ref 200–950)
Basophils Absolute: 73 cells/uL (ref 0–200)
Basophils Relative: 0.8 %
Eosinophils Absolute: 91 cells/uL (ref 15–500)
Eosinophils Relative: 1 %
HCT: 38.6 % (ref 35.0–45.0)
Hemoglobin: 12.8 g/dL (ref 11.7–15.5)
Lymphs Abs: 2867 cells/uL (ref 850–3900)
MCH: 28.6 pg (ref 27.0–33.0)
MCHC: 33.2 g/dL (ref 32.0–36.0)
MCV: 86.2 fL (ref 80.0–100.0)
MPV: 11.1 fL (ref 7.5–12.5)
Monocytes Relative: 6.5 %
Neutro Abs: 5478 cells/uL (ref 1500–7800)
Neutrophils Relative %: 60.2 %
Platelets: 330 10*3/uL (ref 140–400)
RBC: 4.48 10*6/uL (ref 3.80–5.10)
RDW: 13.6 % (ref 11.0–15.0)
Total Lymphocyte: 31.5 %
WBC: 9.1 10*3/uL (ref 3.8–10.8)

## 2018-09-26 ENCOUNTER — Ambulatory Visit (INDEPENDENT_AMBULATORY_CARE_PROVIDER_SITE_OTHER): Payer: Medicare Other | Admitting: Nurse Practitioner

## 2018-09-26 ENCOUNTER — Other Ambulatory Visit: Payer: Self-pay

## 2018-09-26 ENCOUNTER — Encounter: Payer: Self-pay | Admitting: Nurse Practitioner

## 2018-09-26 VITALS — BP 122/80 | HR 84 | Temp 98.4°F | Ht 63.0 in | Wt 226.0 lb

## 2018-09-26 VITALS — BP 144/98 | HR 84 | Temp 98.4°F | Ht 63.0 in | Wt 226.0 lb

## 2018-09-26 DIAGNOSIS — M150 Primary generalized (osteo)arthritis: Secondary | ICD-10-CM

## 2018-09-26 DIAGNOSIS — E2839 Other primary ovarian failure: Secondary | ICD-10-CM | POA: Diagnosis not present

## 2018-09-26 DIAGNOSIS — I10 Essential (primary) hypertension: Secondary | ICD-10-CM

## 2018-09-26 DIAGNOSIS — Z0000 Encounter for general adult medical examination without abnormal findings: Secondary | ICD-10-CM

## 2018-09-26 DIAGNOSIS — Z1231 Encounter for screening mammogram for malignant neoplasm of breast: Secondary | ICD-10-CM | POA: Diagnosis not present

## 2018-09-26 DIAGNOSIS — M65331 Trigger finger, right middle finger: Secondary | ICD-10-CM | POA: Diagnosis not present

## 2018-09-26 DIAGNOSIS — M159 Polyosteoarthritis, unspecified: Secondary | ICD-10-CM

## 2018-09-26 DIAGNOSIS — E782 Mixed hyperlipidemia: Secondary | ICD-10-CM

## 2018-09-26 DIAGNOSIS — F325 Major depressive disorder, single episode, in full remission: Secondary | ICD-10-CM

## 2018-09-26 MED ORDER — ZOSTER VAC RECOMB ADJUVANTED 50 MCG/0.5ML IM SUSR: 0.5000 mL | Freq: Once | INTRAMUSCULAR | 1 refills | Status: AC

## 2018-09-26 MED ORDER — LISINOPRIL-HYDROCHLOROTHIAZIDE 20-12.5 MG PO TABS: 2.0000 | ORAL_TABLET | Freq: Every evening | ORAL | 1 refills | Status: DC

## 2018-09-26 NOTE — Patient Instructions (Addendum)
Please sign for record release when you check out.   Bring medications in for [place] to verify   Increase lisinopril-hctz to 2 tablets by mouth daily in the AM  Follow up in 2 weeks for blood pressure follow up with blood pressure readings   DASH Eating Plan DASH stands for "Dietary Approaches to Stop Hypertension." The DASH eating plan is a healthy eating plan that has been shown to reduce high blood pressure (hypertension). It may also reduce your risk for type 2 diabetes, heart disease, and stroke. The DASH eating plan may also help with weight loss. What are tips for following this plan?  General guidelines  Avoid eating more than 2,300 mg (milligrams) of salt (sodium) a day. If you have hypertension, you may need to reduce your sodium intake to 1,500 mg a day.  Limit alcohol intake to no more than 1 drink a day for nonpregnant women and 2 drinks a day for men. One drink equals 12 oz of beer, 5 oz of wine, or 1 oz of hard liquor.  Work with your health care provider to maintain a healthy body weight or to lose weight. Ask what an ideal weight is for you.  Get at least 30 minutes of exercise that causes your heart to beat faster (aerobic exercise) most days of the week. Activities may include walking, swimming, or biking.  Work with your health care provider or diet and nutrition specialist (dietitian) to adjust your eating plan to your individual calorie needs. Reading food labels   Check food labels for the amount of sodium per serving. Choose foods with less than 5 percent of the Daily Value of sodium. Generally, foods with less than 300 mg of sodium per serving fit into this eating plan.  To find whole grains, look for the word "whole" as the first word in the ingredient list. Shopping  Buy products labeled as "low-sodium" or "no salt added."  Buy fresh foods. Avoid canned foods and premade or frozen meals. Cooking  Avoid adding salt when cooking. Use salt-free seasonings or  herbs instead of table salt or sea salt. Check with your health care provider or pharmacist before using salt substitutes.  Do not fry foods. Cook foods using healthy methods such as baking, boiling, grilling, and broiling instead.  Cook with heart-healthy oils, such as olive, canola, soybean, or sunflower oil. Meal planning  Eat a balanced diet that includes: ? 5 or more servings of fruits and vegetables each day. At each meal, try to fill half of your plate with fruits and vegetables. ? Up to 6-8 servings of whole grains each day. ? Less than 6 oz of lean meat, poultry, or fish each day. A 3-oz serving of meat is about the same size as a deck of cards. One egg equals 1 oz. ? 2 servings of low-fat dairy each day. ? A serving of nuts, seeds, or beans 5 times each week. ? Heart-healthy fats. Healthy fats called Omega-3 fatty acids are found in foods such as flaxseeds and coldwater fish, like sardines, salmon, and mackerel.  Limit how much you eat of the following: ? Canned or prepackaged foods. ? Food that is high in trans fat, such as fried foods. ? Food that is high in saturated fat, such as fatty meat. ? Sweets, desserts, sugary drinks, and other foods with added sugar. ? Full-fat dairy products.  Do not salt foods before eating.  Try to eat at least 2 vegetarian meals each week.  Eat  more home-cooked food and less restaurant, buffet, and fast food.  When eating at a restaurant, ask that your food be prepared with less salt or no salt, if possible. What foods are recommended? The items listed may not be a complete list. Talk with your dietitian about what dietary choices are best for you. Grains Whole-grain or whole-wheat bread. Whole-grain or whole-wheat pasta. Brown rice. Tammy [name]. Bulgur. Whole-grain and low-sodium cereals. Pita bread. Low-fat, low-sodium crackers. Whole-wheat flour tortillas. Vegetables Fresh or frozen vegetables (raw, steamed, roasted, or grilled).  Low-sodium or reduced-sodium tomato and vegetable juice. Low-sodium or reduced-sodium tomato sauce and tomato paste. Low-sodium or reduced-sodium canned vegetables. Fruits All fresh, dried, or frozen fruit. Canned fruit in natural juice (without added sugar). Meat and other protein foods Skinless chicken or [place]. Ground chicken or [place]. Pork with fat trimmed off. Fish and seafood. Egg whites. Dried beans, peas, or lentils. Unsalted nuts, nut butters, and seeds. Unsalted canned beans. Lean cuts of beef with fat trimmed off. Low-sodium, lean deli meat. Dairy Low-fat (1%) or fat-free (skim) milk. Fat-free, low-fat, or reduced-fat cheeses. Nonfat, low-sodium ricotta or cottage cheese. Low-fat or nonfat yogurt. Low-fat, low-sodium cheese. Fats and oils Soft margarine without trans fats. Vegetable oil. Low-fat, reduced-fat, or light mayonnaise and salad dressings (reduced-sodium). Canola, safflower, olive, soybean, and sunflower oils. Avocado. Seasoning and other foods Herbs. Spices. Seasoning mixes without salt. Unsalted popcorn and pretzels. Fat-free sweets. What foods are not recommended? The items listed may not be a complete list. Talk with your dietitian about what dietary choices are best for you. Grains Baked goods made with fat, such as croissants, muffins, or some breads. Dry pasta or rice meal packs. Vegetables Creamed or fried vegetables. Vegetables in a cheese sauce. Regular canned vegetables (not low-sodium or reduced-sodium). Regular canned tomato sauce and paste (not low-sodium or reduced-sodium). Regular tomato and vegetable juice (not low-sodium or reduced-sodium). Tammy [name]. Olives. Fruits Canned fruit in a light or heavy syrup. Fried fruit. Fruit in cream or butter sauce. Meat and other protein foods Fatty cuts of meat. Ribs. Fried meat. Tammy [name]. Sausage. Bologna and other processed lunch meats. Salami. Fatback. Hotdogs. Bratwurst. Salted nuts and seeds. Canned beans with added  salt. Canned or smoked fish. Whole eggs or egg yolks. Chicken or [place] with skin. Dairy Whole or 2% milk, cream, and half-and-half. Whole or full-fat cream cheese. Whole-fat or sweetened yogurt. Full-fat cheese. Nondairy creamers. Whipped toppings. Processed cheese and cheese spreads. Fats and oils Butter. Stick margarine. Lard. Shortening. Ghee. Bacon fat. Tropical oils, such as coconut, palm kernel, or palm oil. Seasoning and other foods Salted popcorn and pretzels. Onion salt, garlic salt, seasoned salt, table salt, and sea salt. Worcestershire sauce. Tartar sauce. Barbecue sauce. Teriyaki sauce. Soy sauce, including reduced-sodium. Steak sauce. Canned and packaged gravies. Fish sauce. Oyster sauce. Cocktail sauce. Horseradish that you find on the shelf. Ketchup. Mustard. Meat flavorings and tenderizers. Bouillon cubes. Hot sauce and Tabasco sauce. Premade or packaged marinades. Premade or packaged taco seasonings. Relishes. Regular salad dressings. Where to find more information:  National Heart, Lung, and [place]: https://wilson-eaton.com/  American Heart Association: www.heart.org Summary  The DASH eating plan is a healthy eating plan that has been shown to reduce high blood pressure (hypertension). It may also reduce your risk for type 2 diabetes, heart disease, and stroke.  With the DASH eating plan, you should limit salt (sodium) intake to 2,300 mg a day. If you have hypertension, you may need to reduce your sodium intake to  1,500 mg a day.  When on the DASH eating plan, aim to eat more fresh fruits and vegetables, whole grains, lean proteins, low-fat dairy, and heart-healthy fats.  Work with your health care provider or diet and nutrition specialist (dietitian) to adjust your eating plan to your individual calorie needs. This information is not intended to replace advice given to you by your health care provider. Make sure you discuss any questions you have with your health care  provider. Document Released: 04/08/2011 Document Revised: 04/12/2016 Document Reviewed: 04/12/2016 Elsevier Interactive Patient Education  2019 [employer].

## 2018-09-26 NOTE — Progress Notes (Signed)
Subjective:   Tammy [name] is a 68 y.o. female who presents for Medicare Annual (Subsequent) preventive examination.  Review of Systems:         Objective:     Vitals: BP (!) 144/98   Pulse 84   Temp 98.4 F (36.9 C) (Oral)   Ht 5\' 3"  (1.6 m)   Wt 226 lb (102.5 kg)   SpO2 98%   BMI 40.03 kg/m   Body mass index is 40.03 kg/m.  Advanced Directives 09/26/2018 03/19/2015 03/19/2015 01/16/2014 01/16/2014 01/09/2014  Does Patient Have a Medical Advance Directive? No No No No - No  Would patient like information on creating a medical advance directive? Yes (MAU/Ambulatory/Procedural Areas - Information given) No - patient declined information No - patient declined information No - patient declined information (No Data) Yes - Transport plannerducational materials given    Tobacco Social History   Tobacco Use  Smoking Status Never Smoker  Smokeless Tobacco Never Used     Counseling given: Not Answered   Clinical Intake:                       Past Medical History:  Diagnosis Date  . Anxiety   . Arthritis   . Benign essential HTN 08/04/2015  . Depression   . Heart murmur    hx of   . Hyperlipidemia   . Hypertension   . PONV (postoperative nausea and vomiting)    1 time   Past Surgical History:  Procedure Laterality Date  . 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 TOTAL HIP ARTHROPLASTY Right 03/19/2015   Procedure: TOTAL HIP ARTHROPLASTY ANTERIOR APPROACH;  Surgeon: Ollen GrossFrank Aluisio, MD;  Location: [facility];  Service: Orthopedics;  Laterality: Right;   Family History  Problem Relation Age of Onset  . Cancer Mother 872       pancreatic  . Other Sister   . Diabetes Sister   . Hepatitis Sister        Hep C  . Stroke Sister        x 3  . High blood pressure Sister   . Other Other   . Other Other    Social History    Socioeconomic History  . Marital status: Single    Spouse name: Not on file  . Number of children: Not on file  . Years of education: Not on file  . Highest education level: Not on file  Occupational History  . Not on file  Social Needs  . Financial resource strain: Not on file  . Food insecurity:    Worry: Not on file    Inability: Not on file  . Transportation needs:    Medical: Not on file    Non-medical: Not on file  Tobacco Use  . Smoking status: Never Smoker  . Smokeless tobacco: Never Used  Substance and Sexual Activity  . Alcohol use: No  . Drug use: No  . Sexual activity: Not on file  Lifestyle  . Physical activity:    Days per week: Not on file    Minutes per session: Not on file  . Stress: Not on file  Relationships  . Social connections:    Talks on phone: Not on file    Gets together: Not on file    Attends religious service: Not on file  Active member of club or organization: Not on file    Attends meetings of clubs or organizations: Not on file    Relationship status: Not on file  Other Topics Concern  . Not on file  Social History Narrative   Social History      Diet?       Do you drink/eat things with caffeine? Coffee and soda      Marital status?    Divorced                              What year were you married? 1991      Do you live in a house, apartment, assisted living, condo, trailer, etc.? Apartment      Is it one or more stories?  2       How many persons live in your home? 4      Do you have any pets in your home? (please list)  None      Highest level of education completed? Some college      Current or past profession:  Pharmacy Tech      Do you exercise?   Very rarely                                   Type & how often?  Not often      Advanced Directives: No      Do you have a living will? No      Do you have a DNR form? No                                  If not, do you want to discuss one?      Do you have signed  POA/HPOA for forms?       Functional Status      Do you have difficulty bathing or dressing yourself? No      Do you have difficulty preparing food or eating?  No      Do you have difficulty managing your medications?  No      Do you have difficulty managing your finances?  No      Do you have difficulty affording your medications? Sometimes    Outpatient Encounter Medications as of 09/26/2018  Medication Sig  . acetaminophen (TYLENOL) 500 MG tablet Take 500 mg by mouth every 6 (six) hours as needed.  Marland Kitchen atorvastatin (LIPITOR) 40 MG tablet Take 1 tablet (40 mg total) by mouth daily. Takes in the pm  . Blood Pressure Monitoring (BLOOD PRESSURE CUFF) MISC Check blood pressure daily 1 hour following b/p medication (Patient not taking: Reported on 09/26/2018)  . Cetirizine HCl 10 MG CAPS Take by mouth as needed.  Marland Kitchen escitalopram (LEXAPRO) 10 MG tablet Take 1 tablet (10 mg total) by mouth every evening.  Marland Kitchen lisinopril-hydrochlorothiazide (PRINZIDE,ZESTORETIC) 20-12.5 MG tablet Take 1 tablet by mouth every evening.   No facility-administered encounter medications on file as of 09/26/2018.     Activities of Daily Living No flowsheet data found.  Patient Care Team: [name], NP as PCP - General (Geriatric Medicine)    Assessment:   This is a routine wellness examination for Tammy [name].  Exercise Activities and Dietary recommendations    Goals   None  Fall Risk Fall Risk  09/26/2018  Falls in the past year? 1  Number falls in past yr: 0  Injury with Fall? 0   Is the patient's home free of loose throw rugs in walkways, pet beds, electrical cords, etc?   yes      Grab bars in the bathroom? no      Handrails on the stairs?   yes      Adequate lighting?   yes  Timed Get Up and Go performed: 2 secs  Depression Screen PHQ 2/9 Scores 09/26/2018  PHQ - 2 Score 0     Cognitive Function MMSE - Mini Mental State Exam 09/26/2018  Orientation to time 5  Orientation to Place  4  Registration 3  Attention/ Calculation 5  Recall 1  Language- name 2 objects 2  Language- repeat 1  Language- follow 3 step command 2  Language- read & follow direction 1  Write a sentence 1  Copy design 1  Total score 26         There is no immunization history on file for this patient.  Qualifies for Shingles Vaccine? Yes   Screening Tests Health Maintenance  Topic Date Due  . Hepatitis C Screening  11/06/1950  . TETANUS/TDAP  09/02/1970  . MAMMOGRAM  09/01/2001  . COLONOSCOPY  09/01/2001  . DEXA SCAN  09/01/2016  . PNA vac Low Risk Adult (1 of 2 - PCV13) 09/01/2016  . INFLUENZA VACCINE  12/02/2018    Cancer Screenings: Lung: Low Dose CT Chest recommended if Age 46-80 years, 30 pack-year currently smoking OR have quit w/in 15years. Patient does not qualify. Breast:  Up to date on Mammogram? No   Up to date of Bone Density/Dexa? No Colorectal: needs  Additional Screenings:  Hepatitis C Screening: none.      Plan:      I have personally reviewed and noted the following in the patient's chart:   . Medical and social history . Use of alcohol, tobacco or illicit drugs  . Current medications and supplements . Functional ability and status . Nutritional status . Physical activity . Advanced directives . List of other physicians . Hospitalizations, surgeries, and ER visits in previous 12 months . Vitals . Screenings to include cognitive, depression, and falls . Referrals and appointments  In addition, I have reviewed and discussed with patient certain preventive protocols, quality metrics, and best practice recommendations. A written personalized care plan for preventive services as well as general preventive health recommendations were provided to patient.     [name], NP  09/26/2018

## 2018-09-26 NOTE — Progress Notes (Signed)
Careteam: Patient Care Team: [name], NP as PCP - General (Geriatric Medicine)  Advanced Directive information    No Known Allergies  Chief Complaint  Patient presents with  . Medical Management of Chronic Issues    6 week follow-up  . Hand Problem    Patient with right hand concerns, middle finger      HPI: Patient is a 68 y.o. Tammy [name] seen in the office today for routine follow up.   htn- did not get blood pressure cuff. Stopped putting salt in food.  Still uses can vegetables. Tries to do low sodium. Taking lisinopril-hctz 20-12.5. she was previously on metoprolol 100 mg daily and felodipine 10 mg daily   Right hand middle finger- on and off ache, uses tylenol to help with pain. Deals with it. Finger gets stuck.   Anxiety/depression- in remission on lexapro.   Review of Systems:  Review of Systems  Constitutional: Negative for chills, fever and weight loss.  HENT: Negative for tinnitus.   Respiratory: Negative for cough, sputum production and shortness of breath.   Cardiovascular: Negative for chest pain, palpitations and leg swelling.  Gastrointestinal: Negative for abdominal pain, constipation, diarrhea and heartburn.  Genitourinary: Negative for dysuria, frequency and urgency.  Musculoskeletal: Positive for joint pain. Negative for back pain, falls and myalgias.  Skin: Negative.   Neurological: Negative for dizziness and headaches.  Endo/Heme/Allergies: Positive for environmental allergies.  Psychiatric/Behavioral: Positive for depression. Negative for memory loss. The patient is nervous/anxious. The patient does not have insomnia.        Controlled on medication    Past Medical History:  Diagnosis Date  . Anxiety   . Arthritis   . Benign essential HTN 08/04/2015  . Depression   . Heart murmur    hx of   . Hyperlipidemia   . Hypertension   . PONV (postoperative nausea and vomiting)    1 time   Past Surgical History:  Procedure Laterality  Date  . 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 TOTAL HIP ARTHROPLASTY Right 03/19/2015   Procedure: TOTAL HIP ARTHROPLASTY ANTERIOR APPROACH;  Surgeon: Ollen Gross, MD;  Location: [facility];  Service: Orthopedics;  Laterality: Right;   Social History:   reports that she has never smoked. She has never used smokeless tobacco. She reports that she does not drink alcohol or use drugs.  Family History  Problem Relation Age of Onset  . Cancer Mother 59       pancreatic  . Other Sister   . Diabetes Sister   . Hepatitis Sister        Hep C  . Stroke Sister        x 3  . High blood pressure Sister   . Other Other   . Other Other     Medications: Patient's Medications  New Prescriptions   No medications on file  Previous Medications   ACETAMINOPHEN (TYLENOL) 500 MG TABLET    Take 500 mg by mouth every 6 (six) hours as needed.   ATORVASTATIN (LIPITOR) 40 MG TABLET    Take 1 tablet (40 mg total) by mouth daily. Takes in the pm   BLOOD PRESSURE MONITORING (BLOOD PRESSURE CUFF) MISC    Check blood pressure daily 1 hour following b/p medication   CETIRIZINE HCL 10 MG CAPS    Take by  mouth as needed.   ESCITALOPRAM (LEXAPRO) 10 MG TABLET    Take 1 tablet (10 mg total) by mouth every evening.   LISINOPRIL-HYDROCHLOROTHIAZIDE (PRINZIDE,ZESTORETIC) 20-12.5 MG TABLET    Take 1 tablet by mouth every evening.  Modified Medications   No medications on file  Discontinued Medications   No medications on file     Physical Exam:  Vitals:   09/26/18 0930  BP: (!) 144/98  Pulse: 84  Temp: 98.4 F (36.9 C)  TempSrc: Oral  SpO2: 98%  Weight: 226 lb (102.5 kg)  Height: 5\' 3"  (1.6 m)   Body mass index is 40.03 kg/m.  Physical Exam Constitutional:      General: She is not in acute distress.    Appearance: She is well-developed. She is not  diaphoretic.  HENT:     Head: Normocephalic and atraumatic.     Mouth/Throat:     Pharynx: No oropharyngeal exudate.  Eyes:     Conjunctiva/sclera: Conjunctivae normal.     Pupils: Pupils are equal, round, and reactive to light.  Neck:     Musculoskeletal: Normal range of motion and neck supple.  Cardiovascular:     Rate and Rhythm: Normal rate and regular rhythm.     Heart sounds: Normal heart sounds.  Pulmonary:     Effort: Pulmonary effort is normal.     Breath sounds: Normal breath sounds.  Abdominal:     General: Bowel sounds are normal.     Palpations: Abdomen is soft.  Musculoskeletal:        General: No tenderness.  Skin:    General: Skin is warm and dry.  Neurological:     Mental Status: She is alert and oriented to person, place, and time.     Labs reviewed: Basic Metabolic Panel: Recent Labs    08/18/18 0927  NA 140  K 4.1  CL 104  CO2 28  GLUCOSE 85  BUN 14  CREATININE 0.67  CALCIUM 9.5   Liver Function Tests: Recent Labs    08/18/18 0927  AST 23  ALT 17  BILITOT 0.6  PROT 7.1   No results for input(s): LIPASE, AMYLASE in the last 8760 hours. No results for input(s): AMMONIA in the last 8760 hours. CBC: Recent Labs    08/18/18 0927  WBC 9.1  NEUTROABS 5,478  HGB 12.8  HCT 38.6  MCV Tammy.2  PLT 330   Lipid Panel: Recent Labs    08/18/18 0927  CHOL 164  HDL 66  LDLCALC 83  TRIG 72  CHOLHDL 2.5   TSH: No results for input(s): TSH in the last 8760 hours. A1C: No results found for: HGBA1C   Assessment/Plan 1. Trigger middle finger of right hand Ongoing, becoming more bothersome.  - Ambulatory referral to Orthopedics  2. Mixed hyperlipidemia Continues on lipitor daily. Encouraged dietary modifications.   3. Depression, major, single episode, complete remission (HCC) In-remission on lexapro, will continue current regimen.   4. Primary osteoarthritis involving multiple joints Stable, uses occasional tylenol PRN  5.  Essential hypertension Elevated on recheck. Does not check blood pressure at home but states she will get a cuff. To record at home and bring in office reading on follow up. DASH diet recommended.   Will increase lisinopril to 2 tablets daily along with dietary modifications.  - lisinopril-hydrochlorothiazide (ZESTORETIC) 20-12.5 MG tablet; Take 2 tablets by mouth every evening.  Dispense: 180 tablet; Refill: 1  Next appt: 2 weeks for bp follow up.  Janene HarveyJessica K.  [place], [facility] [phone]

## 2018-09-26 NOTE — Addendum Note (Signed)
Addended by: Maurice Small on: 09/26/2018 10:02 AM   Modules accepted: Orders

## 2018-09-26 NOTE — Patient Instructions (Signed)
Tammy [name] , Thank you Tammy [name] taking time to come for your Medicare Wellness Visit. I appreciate your ongoing commitment to your health goals. Please review the following plan we discussed and let me know if I can assist you in the future.   Screening recommendations/referrals: Colonoscopy: please complete cologuard screening Mammogram: order placed, please schedule Bone Density: ordering placed, please schedule Recommended yearly ophthalmology/optometry visit for glaucoma screening and checkup Recommended yearly dental visit for hygiene and checkup  Vaccinations: Influenza vaccine due 8/20020 Pneumococcal vaccine- awaiting records Tdap vaccine -awaiting records Shingles vaccine -sent to pharmacy    Advanced directives: information provided today  Conditions/risks identified: needs to increase activity, lose weight and lower blood pressure  to avoid cardiovascular event  Next appointment: 1 year for awv   Preventive Care 65 Years and Older, Female Preventive care refers to lifestyle choices and visits with your health care provider that can promote health and wellness. What does preventive care include?  A yearly physical exam. This is also called an annual well check.  Dental exams once or twice a year.  Routine eye exams. Ask your health care provider how often you should have your eyes checked.  Personal lifestyle choices, including:  Daily care of your teeth and gums.  Regular physical activity.  Eating a healthy diet.  Avoiding tobacco and drug use.  Limiting alcohol use.  Practicing safe sex.  Taking low-dose aspirin every day.  Taking vitamin and mineral supplements as recommended by your health care provider. What happens during an annual well check? The services and screenings done by your health care provider during your annual well check will depend on your age, overall health, lifestyle risk factors, and family history of disease. Counseling  Your health  care provider may ask you questions about your:  Alcohol use.  Tobacco use.  Drug use.  Emotional well-being.  Home and relationship well-being.  Sexual activity.  Eating habits.  History of falls.  Memory and ability to understand (cognition).  Work and work Astronomerenvironment.  Reproductive health. Screening  You may have the following tests or measurements:  Height, weight, and BMI.  Blood pressure.  Lipid and cholesterol levels. These may be checked every 5 years, or more frequently if you are over 67 years old.  Skin check.  Lung cancer screening. You may have this screening every year starting at age 67 if you have a 30-pack-year history of smoking and currently smoke or have quit within the past 15 years.  Fecal occult blood test (FOBT) of the stool. You may have this test every year starting at age 67.  Flexible sigmoidoscopy or colonoscopy. You may have a sigmoidoscopy every 5 years or a colonoscopy every 10 years starting at age 67.  Hepatitis C blood test.  Hepatitis B blood test.  Sexually transmitted disease (STD) testing.  Diabetes screening. This is done by checking your blood sugar (glucose) after you have not eaten for a while (fasting). You may have this done every 1-3 years.  Bone density scan. This is done to screen for osteoporosis. You may have this done starting at age 67.  Mammogram. This may be done every 1-2 years. Talk to your health care provider about how often you should have regular mammograms. Talk with your health care provider about your test results, treatment options, and if necessary, the need for more tests. Vaccines  Your health care provider may recommend certain vaccines, such as:  Influenza vaccine. This is recommended every year.  Tetanus,  diphtheria, and acellular pertussis (Tdap, Td) vaccine. You may need a Td booster every 10 years.  Zoster vaccine. You may need this after age 47.  Pneumococcal 13-valent conjugate  (PCV13) vaccine. One dose is recommended after age 65.  Pneumococcal polysaccharide (PPSV23) vaccine. One dose is recommended after age 55. Talk to your health care provider about which screenings and vaccines you need and how often you need them. This information is not intended to replace advice given to you by your health care provider. Make sure you discuss any questions you have with your health care provider. Document Released: 05/16/2015 Document Revised: 01/07/2016 Document Reviewed: 02/18/2015 Elsevier Interactive Patient Education  2017 [place] Prevention in the Home Falls can cause injuries. They can happen to people of all ages. There are many things you can do to make your home safe and to help prevent falls. What can I do on the outside of my home?  Regularly fix the edges of walkways and driveways and fix any cracks.  Remove anything that might make you trip as you walk through a door, such as a raised step or threshold.  Trim any bushes or trees on the path to your home.  Use bright outdoor lighting.  Clear any walking paths of anything that might make someone trip, such as rocks or tools.  Regularly check to see if handrails are loose or broken. Make sure that both sides of any steps have handrails.  Any raised decks and porches should have guardrails on the edges.  Have any leaves, snow, or ice cleared regularly.  Use sand or salt on walking paths during winter.  Clean up any spills in your garage right away. This includes oil or grease spills. What can I do in the bathroom?  Use night lights.  Install grab bars by the toilet and in the tub and shower. Do not use towel bars as grab bars.  Use non-skid mats or decals in the tub or shower.  If you need to sit down in the shower, use a plastic, non-slip stool.  Keep the floor dry. Clean up any water that spills on the floor as soon as it happens.  Remove soap buildup in the tub or shower  regularly.  Attach bath mats securely with double-sided non-slip rug tape.  Do not have throw rugs and other things on the floor that can make you trip. What can I do in the bedroom?  Use night lights.  Make sure that you have a light by your bed that is easy to reach.  Do not use any sheets or blankets that are too big for your bed. They should not hang down onto the floor.  Have a firm chair that has side arms. You can use this for support while you get dressed.  Do not have throw rugs and other things on the floor that can make you trip. What can I do in the kitchen?  Clean up any spills right away.  Avoid walking on wet floors.  Keep items that you use a lot in easy-to-reach places.  If you need to reach something above you, use a strong step stool that has a grab bar.  Keep electrical cords out of the way.  Do not use floor polish or wax that makes floors slippery. If you must use wax, use non-skid floor wax.  Do not have throw rugs and other things on the floor that can make you trip. What can I do with  my stairs?  Do not leave any items on the stairs.  Make sure that there are handrails on both sides of the stairs and use them. Fix handrails that are broken or loose. Make sure that handrails are as long as the stairways.  Check any carpeting to make sure that it is firmly attached to the stairs. Fix any carpet that is loose or worn.  Avoid having throw rugs at the top or bottom of the stairs. If you do have throw rugs, attach them to the floor with carpet tape.  Make sure that you have a light switch at the top of the stairs and the bottom of the stairs. If you do not have them, ask someone to add them for you. What else can I do to help prevent falls?  Wear shoes that:  Do not have high heels.  Have rubber bottoms.  Are comfortable and fit you well.  Are closed at the toe. Do not wear sandals.  If you use a stepladder:  Make sure that it is fully  opened. Do not climb a closed stepladder.  Make sure that both sides of the stepladder are locked into place.  Ask someone to hold it for you, if possible.  Clearly mark and make sure that you can see:  Any grab bars or handrails.  First and last steps.  Where the edge of each step is.  Use tools that help you move around (mobility aids) if they are needed. These include:  Canes.  Walkers.  Scooters.  Crutches.  Turn on the lights when you go into a dark area. Replace any light bulbs as soon as they burn out.  Set up your furniture so you have a clear path. Avoid moving your furniture around.  If any of your floors are uneven, fix them.  If there are any pets around you, be aware of where they are.  Review your medicines with your doctor. Some medicines can make you feel dizzy. This can increase your chance of falling. Ask your doctor what other things that you can do to help prevent falls. This information is not intended to replace advice given to you by your health care provider. Make sure you discuss any questions you have with your health care provider. Document Released: 02/13/2009 Document Revised: 09/25/2015 Document Reviewed: 05/24/2014 Elsevier Interactive Patient Education  2017 [employer].

## 2018-10-06 ENCOUNTER — Other Ambulatory Visit: Payer: Self-pay | Admitting: Nurse Practitioner

## 2018-10-06 DIAGNOSIS — E782 Mixed hyperlipidemia: Secondary | ICD-10-CM

## 2018-10-06 DIAGNOSIS — F419 Anxiety disorder, unspecified: Secondary | ICD-10-CM

## 2018-10-06 DIAGNOSIS — F325 Major depressive disorder, single episode, in full remission: Secondary | ICD-10-CM

## 2018-10-10 ENCOUNTER — Ambulatory Visit: Payer: Medicare Other | Admitting: Nurse Practitioner

## 2018-10-30 ENCOUNTER — Ambulatory Visit (INDEPENDENT_AMBULATORY_CARE_PROVIDER_SITE_OTHER): Payer: Medicare Other | Admitting: Nurse Practitioner

## 2018-10-30 ENCOUNTER — Other Ambulatory Visit: Payer: Self-pay

## 2018-10-30 ENCOUNTER — Encounter: Payer: Self-pay | Admitting: Nurse Practitioner

## 2018-10-30 DIAGNOSIS — F325 Major depressive disorder, single episode, in full remission: Secondary | ICD-10-CM | POA: Diagnosis not present

## 2018-10-30 DIAGNOSIS — F419 Anxiety disorder, unspecified: Secondary | ICD-10-CM | POA: Diagnosis not present

## 2018-10-30 DIAGNOSIS — I10 Essential (primary) hypertension: Secondary | ICD-10-CM

## 2018-10-30 DIAGNOSIS — E782 Mixed hyperlipidemia: Secondary | ICD-10-CM | POA: Diagnosis not present

## 2018-10-30 MED ORDER — ATORVASTATIN CALCIUM 40 MG PO TABS: ORAL_TABLET | ORAL | 1 refills | Status: DC

## 2018-10-30 MED ORDER — ESCITALOPRAM OXALATE 10 MG PO TABS: 10.0000 mg | ORAL_TABLET | Freq: Every day | ORAL | 1 refills | Status: DC

## 2018-10-30 NOTE — Patient Instructions (Signed)

## 2018-10-30 NOTE — Progress Notes (Signed)
Patient ID: Tammy [name], female   DOB: 08/09/1950, 68 y.o.   MRN: [id number] This service is provided via telemedicine  No vital signs collected/recorded due to the encounter was a telemedicine visit.   Location of patient (ex: home, work):  HOME  Patient consents to a telephone visit:  YES  Location of the provider (ex: office, home):  OFFICE  Name of any referring provider:  Abbey ChattersJESSICA Anie Juniel, NP  Names of all persons participating in the telemedicine service and their role in the encounter:  PATIENT, Tammy [name], CMA, Tammy ChattersJESSICA Kenyetta Fife, NP  Time spent on call:  4:19     Careteam: Patient Care Team: Tammy [name], Tammy Patin K, NP as PCP - General (Geriatric Medicine)  Advanced Directive information Does Patient Have a Medical Advance Directive?: No, Would patient like information on creating a medical advance directive?: No - Patient declined  No Known Allergies  Chief Complaint  Patient presents with  . Medical Management of Chronic Issues    2 week follow-up for BP     HPI: Patient is a 68 y.o. female for follow up blood pressure. Has not gotten her blood pressure cuff yet. Has not checked blood pressure at home. Would like to call in and give blood pressure reading after she gets cuff.  She is now talking lisinopril-hctz 20-12.5 2 tablets daily. No adverse effects, feeling well.  Feels like she is doing well with diet modifications in trying to cut back on sodium.   Taking ASA but stopped a few months ago bc it upset her stomach.  Restarted EC ASA and still having stomach issues with it.   Hyperlipidemia- working on diet, continues on lipitor 40 mg daily without worsening myalgias. Needs refill on lipitor.   Mood has been doing well on lexapro.      Review of Systems:  Review of Systems  Constitutional: Negative for chills, fever and weight loss.  HENT: Negative for tinnitus.   Respiratory: Negative for cough, sputum production and shortness of breath.    Cardiovascular: Negative for chest pain, palpitations and leg swelling.  Gastrointestinal: Negative for abdominal pain, constipation, diarrhea and heartburn.  Genitourinary: Negative for dysuria, frequency and urgency.  Musculoskeletal: Positive for joint pain (stable). Negative for back pain, falls and myalgias.  Skin: Negative.   Neurological: Negative for dizziness and headaches.  Endo/Heme/Allergies: Positive for environmental allergies.  Psychiatric/Behavioral: Positive for depression. Negative for memory loss. The patient is nervous/anxious. The patient does not have insomnia.        Controlled on medication    Past Medical History:  Diagnosis Date  . Anxiety   . Arthritis   . Benign essential HTN 08/04/2015  . Depression   . Heart murmur    hx of   . Hyperlipidemia   . Hypertension   . PONV (postoperative nausea and vomiting)    1 time   Past Surgical History:  Procedure Laterality Date  . 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 TOTAL HIP ARTHROPLASTY Right 03/19/2015   Procedure: TOTAL HIP ARTHROPLASTY ANTERIOR APPROACH;  Surgeon: Ollen GrossFrank Aluisio, MD;  Location: [facility];  Service: Orthopedics;  Laterality: Right;   Social History:   reports that she has never smoked. She has never used smokeless tobacco. She reports that she does not drink alcohol or use drugs.  Family History  Problem Relation Age of Onset  .  Cancer Mother [id number]       pancreatic  . Other Sister   . Diabetes Sister   . Hepatitis Sister        Hep C  . Stroke Sister        x 3  . High blood pressure Sister   . Other Other   . Other Other     Medications: Patient's Medications  New Prescriptions   No medications on file  Previous Medications   ACETAMINOPHEN (TYLENOL) 500 MG TABLET    Take 500 mg by mouth every 6 (six) hours as needed.    ATORVASTATIN (LIPITOR) 40 MG TABLET    TAKE 1 TABLET(40 MG) BY MOUTH DAILY IN THE EVENING   CETIRIZINE HCL 10 MG CAPS    Take 1 capsule by mouth as needed.    ESCITALOPRAM (LEXAPRO) 10 MG TABLET    TAKE 1 TABLET(10 MG) BY MOUTH EVERY EVENING   LISINOPRIL-HYDROCHLOROTHIAZIDE (ZESTORETIC) 20-12.5 MG TABLET    Take 2 tablets by mouth every evening.  Modified Medications   No medications on file  Discontinued Medications   BLOOD PRESSURE MONITORING (BLOOD PRESSURE CUFF) MISC    Check blood pressure daily 1 hour following b/p medication    Physical Exam:  There were no vitals filed for this visit. There is no height or weight on file to calculate BMI. Wt Readings from Last 3 Encounters:  09/26/18 226 lb (102.5 kg)  09/26/18 226 lb (102.5 kg)  02/01/17 245 lb (111.1 kg)     Labs reviewed: Basic Metabolic Panel: Recent Labs    08/18/18 0927  NA 140  [name] 4.1  CL 104  CO2 28  GLUCOSE 85  BUN 14  CREATININE 0.67  CALCIUM 9.5   Liver Function Tests: Recent Labs    08/18/18 0927  AST 23  ALT 17  BILITOT 0.6  PROT 7.1   No results for input(s): LIPASE, AMYLASE in the last 8760 hours. No results for input(s): AMMONIA in the last 8760 hours. CBC: Recent Labs    08/18/18 0927  WBC 9.1  NEUTROABS 5,478  HGB 12.8  HCT 38.6  MCV 86.2  PLT 330   Lipid Panel: Recent Labs    08/18/18 0927  CHOL 164  HDL 66  LDLCALC 83  TRIG 72  CHOLHDL 2.5   TSH: No results for input(s): TSH in the last 8760 hours. A1C: No results found for: HGBA1C   Assessment/Plan 1. Benign essential HTN -has not rechecked blood pressure, but tolerating medication without side effect, getting blood pressure cuff this week and will call in blood pressure readings next week. Continue on dash diet.   2. Mixed hyperlipidemia continues to work on diet.  - atorvastatin (LIPITOR) 40 MG tablet; TAKE 1 TABLET(40 MG) BY MOUTH DAILY IN THE EVENING  Dispense: 90 tablet; Refill: 1  3. Anxiety Stable on  lexapro. Without increase in anxiety or depression. - escitalopram (LEXAPRO) 10 MG tablet; Take 1 tablet (10 mg total) by mouth daily.  Dispense: 90 tablet; Refill: 1  4. Depression, major, single episode, complete remission (HCC) -mood has been stable on current regimen, requesting refill. - escitalopram (LEXAPRO) 10 MG tablet; Take 1 tablet (10 mg total) by mouth daily.  Dispense: 90 tablet; Refill: 1  Next appt: 3 months, sooner if needed  Kasheem Toner [name]. Biagio BorgEubanks, AGNP  [facility] [phone]    Virtual Visit via Telephone Note  I connected with pt on 10/30/18 at 10:00 AM EDT by  telephone and verified that I am speaking with the correct person using two identifiers.  Location: Patient: home Provider: office   I discussed the limitations, risks, security and privacy concerns of performing an evaluation and management service by telephone and the availability of in person appointments. I also discussed with the patient that there may be a patient responsible charge related to this service. The patient expressed understanding and agreed to proceed.   I discussed the assessment and treatment plan with the patient. The patient was provided an opportunity to ask questions and all were answered. The patient agreed with the plan and demonstrated an understanding of the instructions.   The patient was advised to call back or seek an in-person evaluation if the symptoms worsen or if the condition fails to improve as anticipated.  I provided 13 minutes of non-face-to-face time during this encounter.  Carlos American. Harle Battiest Avs printed and mailed

## 2018-12-01 ENCOUNTER — Telehealth: Payer: Self-pay

## 2018-12-01 NOTE — Telephone Encounter (Signed)
Incoming fax received from [employer] indicating "Action Required."  Chief Strategy Officer has made several attempts to encourage patient to return cologuard sample with no success.  It is recommended that the ordering provider make attempts to follow-up with patient as well.  Left message on voicemail for patient to return call when available

## 2018-12-06 NOTE — Telephone Encounter (Signed)
Left message on voicemail for patient to return call when available   

## 2018-12-12 NOTE — Telephone Encounter (Signed)
Left message on voicemail for patient to return call when available   

## 2018-12-15 ENCOUNTER — Other Ambulatory Visit: Payer: Medicare Other

## 2018-12-15 ENCOUNTER — Ambulatory Visit: Payer: Medicare Other

## 2018-12-19 NOTE — Telephone Encounter (Signed)
Letter mailed due to 3 unsuccessful attempts to reach patient by phone

## 2019-01-30 ENCOUNTER — Ambulatory Visit: Payer: Medicare Other | Admitting: Nurse Practitioner

## 2019-01-30 ENCOUNTER — Ambulatory Visit (INDEPENDENT_AMBULATORY_CARE_PROVIDER_SITE_OTHER): Payer: Medicare Other | Admitting: Family

## 2019-01-30 ENCOUNTER — Encounter: Payer: Self-pay | Admitting: Family

## 2019-01-30 ENCOUNTER — Other Ambulatory Visit: Payer: Self-pay

## 2019-01-30 VITALS — BP 192/105

## 2019-01-30 DIAGNOSIS — I10 Essential (primary) hypertension: Secondary | ICD-10-CM

## 2019-01-30 DIAGNOSIS — F419 Anxiety disorder, unspecified: Secondary | ICD-10-CM

## 2019-01-30 DIAGNOSIS — Z23 Encounter for immunization: Secondary | ICD-10-CM

## 2019-01-30 DIAGNOSIS — M150 Primary generalized (osteo)arthritis: Secondary | ICD-10-CM | POA: Diagnosis not present

## 2019-01-30 DIAGNOSIS — J302 Other seasonal allergic rhinitis: Secondary | ICD-10-CM

## 2019-01-30 DIAGNOSIS — E782 Mixed hyperlipidemia: Secondary | ICD-10-CM | POA: Diagnosis not present

## 2019-01-30 DIAGNOSIS — M159 Polyosteoarthritis, unspecified: Secondary | ICD-10-CM

## 2019-01-30 MED ORDER — TETANUS-DIPHTH-ACELL PERTUSSIS 5-2-15.5 LF-MCG/0.5 IM SUSP: 0.5000 mL | Freq: Once | INTRAMUSCULAR | 0 refills | Status: AC

## 2019-01-30 MED ORDER — LISINOPRIL-HYDROCHLOROTHIAZIDE 20-12.5 MG PO TABS: 2.0000 | ORAL_TABLET | Freq: Every evening | ORAL | 1 refills | Status: DC

## 2019-01-30 MED ORDER — AMLODIPINE BESYLATE 5 MG PO TABS: 5.0000 mg | ORAL_TABLET | Freq: Every day | ORAL | 3 refills | Status: DC

## 2019-01-30 NOTE — Progress Notes (Signed)
This service is provided via telemedicine  No vital signs collected/recorded due to the encounter was a telemedicine visit.   Location of patient (ex: home, work):  Home   Patient consents to a telephone visit:  Yes  Location of the provider (ex: office, home):  Office   Name of any referring provider:  Sherrie Mustache, NP   Names of all persons participating in the telemedicine service and their role in the encounter:  Marlowe Sax, NP, Ruthell Rummage CMA, Katha Cabal  Time spent on call:  Ruthell Rummage CMA, spent 8  Minutes on phone with patient.     Location:  [facility]  Provider: Marlowe Sax, NP   Code Status: FULL Goals of Care:  Advanced Directives 10/30/2018  Does Patient Have a Medical Advance Directive? No  Would patient like information on creating a medical advance directive? No - Patient declined     Chief Complaint  Patient presents with  . Medical Management of Chronic Issues    3 month follow up     HPI: Patient is a 68 y.o. female seen today for medical management of chronic diseases. She denies any acute issues this visit. Hypertension - B/p today 190's/100's states taking lisinopril-HCZT 20 -12.5 mg tablet takes 2 Tablet daily.she states used to take metoprolol and Amlodipine in the pst but her blood pressure was low and medication was discontinued in the urgent care.Her stress level is high takes care of her grandchildren 8 yrs,72yr and 72 months old.the 67 yr old has down syndrome very hyper keeps her very busy.she took her blood pressure today after running after the kids.she tried to recheck but blood pressure was still high.she denies any headache,dizziness,change in vision or chest pain.I've discussed with her to check blood pressure in the morning when calm and record then bring readings to office.  Generalized Anxiety - states symptoms stable on lexapro 10 mg tablet daily  Hyperlipidemia - on atorvastatin 10 mg tablet daily.she does not exercise  on a regular basis but states stays activity chasing after the grandchildren.she does not eat much like she used to prior to retiring.   Seasonal allergies - symptoms controlled on Cetirizine 10 mg tablet as needed. Np contact exposure to person with COVID-19  Osteoarthritis - Tylenol as needed has been effective.   Immunization - due for Tdap vaccine,influenza and Pneumonia vaccine.will get her flu and PNA in 2 weeks when she follows up for B/p check.Tdap vaccine order will be send to her pharmacy this visit.   Past Medical History:  Diagnosis Date  . Anxiety   . Arthritis   . Benign essential HTN 08/04/2015  . Depression   . Heart murmur    hx of   . Hyperlipidemia   . Hypertension   . PONV (postoperative nausea and vomiting)    1 time    Past Surgical History:  Procedure Laterality Date  . 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 TOTAL HIP ARTHROPLASTY Right 03/19/2015   Procedure: TOTAL HIP ARTHROPLASTY ANTERIOR APPROACH;  Surgeon: Gaynelle Arabian, MD;  Location: [facility];  Service: Orthopedics;  Laterality: Right;    No Known Allergies  Outpatient Encounter Medications as of 01/30/2019  Medication Sig  . acetaminophen (TYLENOL) 500 MG tablet Take 500 mg by mouth every 6 (six) hours as needed.  Marland Kitchen atorvastatin (LIPITOR) 40 MG tablet TAKE  1 TABLET(40 MG) BY MOUTH DAILY IN THE EVENING  . Cetirizine HCl 10 MG CAPS Take 1 capsule by mouth as needed.   Marland Kitchen escitalopram (LEXAPRO) 10 MG tablet Take 1 tablet (10 mg total) by mouth daily.  Marland Kitchen lisinopril-hydrochlorothiazide (ZESTORETIC) 20-12.5 MG tablet Take 2 tablets by mouth every evening.   No facility-administered encounter medications on file as of 01/30/2019.     Review of Systems:  Review of Systems  Constitutional: Negative for appetite change, chills, fatigue and fever.  HENT:  Negative for congestion, rhinorrhea, sinus pressure, sinus pain, sneezing and sore throat.   Eyes: Negative for discharge, redness and itching.  Respiratory: Negative for cough, chest tightness, shortness of breath and wheezing.   Cardiovascular: Negative for chest pain, palpitations and leg swelling.  Gastrointestinal: Negative for abdominal distention, abdominal pain, constipation, diarrhea, nausea and vomiting.  Endocrine: Negative for cold intolerance, heat intolerance, polydipsia, polyphagia and polyuria.  Genitourinary: Negative for difficulty urinating, dysuria, flank pain, frequency and urgency.  Musculoskeletal: Negative for arthralgias, back pain and gait problem.  Skin: Negative for color change, pallor and rash.  Neurological: Negative for dizziness, speech difficulty, weakness, light-headedness, numbness and headaches.  Hematological: Does not bruise/bleed easily.  Psychiatric/Behavioral: Negative for agitation, confusion and sleep disturbance. The patient is nervous/anxious.     Health Maintenance  Topic Date Due  . Hepatitis C Screening  21-Sep-1950  . TETANUS/TDAP  09/02/1970  . MAMMOGRAM  09/01/2001  . COLONOSCOPY  09/01/2001  . DEXA SCAN  09/01/2016  . PNA vac Low Risk Adult (1 of 2 - PCV13) 09/01/2016  . INFLUENZA VACCINE  12/02/2018    Physical Exam: There were no vitals filed for this visit. There is no height or weight on file to calculate BMI. Physical Exam  Unable to complete on telephone visit.   Labs reviewed: Basic Metabolic Panel: Recent Labs    08/18/18 0927  NA 140  K 4.1  CL 104  CO2 28  GLUCOSE 85  BUN 14  CREATININE 0.67  CALCIUM 9.5   Liver Function Tests: Recent Labs    08/18/18 0927  AST 23  ALT 17  BILITOT 0.6  PROT 7.1   CBC: Recent Labs    08/18/18 0927  WBC 9.1  NEUTROABS 5,478  HGB 12.8  HCT 38.6  MCV 86.2  PLT 330   Lipid Panel: Recent Labs    08/18/18 0927  CHOL 164  HDL 66  LDLCALC 83  TRIG 72   CHOLHDL 2.5   No results found for: HGBA1C  Procedures since last visit: No results found.  Assessment/Plan 1. Essential hypertension Uncontrolled B/p.continue on lisinopril-HCZT 20 -12.5 mg tablet takes 2 Tablet daily.Encouraged stress relieving activities and going for a walk if possible.  - amLODipine (NORVASC) 5 MG tablet; Take 1 tablet (5 mg total) by mouth daily.  Dispense: 90 tablet; Refill: 3 - lisinopril-hydrochlorothiazide (ZESTORETIC) 20-12.5 MG tablet; Take 2 tablets by mouth every evening.  Dispense: 180 tablet; Refill: 1 - Follow up in 2 weeks for B/p recheck.   3. Need for Tdap vaccination Overdue for Tdap vaccine.  - Tdap (ADACEL) 09-01-13.5 LF-MCG/0.5 injection; Inject 0.5 mLs into the muscle once for 1 dose.  Dispense: 0.5 mL; Refill: 0  4. Mixed hyperlipidemia Continue on atorvastatin 10 mg tablet daily.Recommended low carbohydrate,low saturated fats and high vegetable diet.Physical activity as tolerated.   5. Primary osteoarthritis involving multiple joints Continue on Tylenol 500 mg tablet every 6 hour as needed for pain.Walking exercise if possible.  6. Anxiety Stable.continue on lexapro 10 mg tablet daily.   7. Seasonal allergies Stable.continue on cetrizine 10 mg tablet daily as needed.   Labs/tests ordered:  Has lab orders pending.  Next appt:  Follow up in 2 weeks for B/p recheck,Influenza  And pneumonia 23 vaccine.

## 2019-02-05 ENCOUNTER — Encounter: Payer: Self-pay | Admitting: Nurse Practitioner

## 2019-02-12 ENCOUNTER — Other Ambulatory Visit: Payer: Self-pay | Admitting: *Deleted

## 2019-02-12 ENCOUNTER — Telehealth: Payer: Self-pay | Admitting: *Deleted

## 2019-02-12 DIAGNOSIS — I10 Essential (primary) hypertension: Secondary | ICD-10-CM

## 2019-02-12 MED ORDER — AMLODIPINE BESYLATE 5 MG PO TABS: 5.0000 mg | ORAL_TABLET | Freq: Every day | ORAL | 0 refills | Status: DC

## 2019-02-12 NOTE — Telephone Encounter (Signed)
Medication cancelled at [facility].

## 2019-02-12 NOTE — Telephone Encounter (Signed)
Patient called regarding her medication (Felodipine), she stated that you called in amlodipine and that is not the correct medication. She stated she doesn't remember you discussing a change with her. Please Advise!

## 2019-02-12 NOTE — Telephone Encounter (Signed)
Spoke with patient regarding her medication (amlodipine). I informed her that Webb Silversmith stated that she did discuss the medication change due to BP being elevated and wants her to take this medication for 2 weeks.

## 2019-02-12 NOTE — Telephone Encounter (Signed)
Amlodipine discussed and initiated on visit.continue with amlodipine then follow up as directed.

## 2019-02-13 ENCOUNTER — Ambulatory Visit: Payer: Self-pay | Admitting: Family

## 2019-02-15 ENCOUNTER — Ambulatory Visit: Payer: Self-pay | Admitting: Family

## 2019-03-06 ENCOUNTER — Ambulatory Visit (INDEPENDENT_AMBULATORY_CARE_PROVIDER_SITE_OTHER): Payer: Medicare Other | Admitting: Family

## 2019-03-06 ENCOUNTER — Encounter: Payer: Self-pay | Admitting: Family

## 2019-03-06 ENCOUNTER — Other Ambulatory Visit: Payer: Self-pay

## 2019-03-06 VITALS — BP 140/80 | HR 74 | Temp 97.1°F | Resp 18 | Ht 63.0 in | Wt 230.2 lb

## 2019-03-06 DIAGNOSIS — Z1159 Encounter for screening for other viral diseases: Secondary | ICD-10-CM

## 2019-03-06 DIAGNOSIS — F325 Major depressive disorder, single episode, in full remission: Secondary | ICD-10-CM | POA: Diagnosis not present

## 2019-03-06 DIAGNOSIS — F419 Anxiety disorder, unspecified: Secondary | ICD-10-CM

## 2019-03-06 DIAGNOSIS — I10 Essential (primary) hypertension: Secondary | ICD-10-CM | POA: Diagnosis not present

## 2019-03-06 DIAGNOSIS — E782 Mixed hyperlipidemia: Secondary | ICD-10-CM

## 2019-03-06 DIAGNOSIS — Z23 Encounter for immunization: Secondary | ICD-10-CM | POA: Diagnosis not present

## 2019-03-06 MED ORDER — ESCITALOPRAM OXALATE 10 MG PO TABS: 10.0000 mg | ORAL_TABLET | Freq: Every day | ORAL | 1 refills | Status: DC

## 2019-03-06 MED ORDER — ATORVASTATIN CALCIUM 40 MG PO TABS: ORAL_TABLET | ORAL | 1 refills | Status: DC

## 2019-03-06 NOTE — Progress Notes (Signed)
Provider: Marquette Blodgett FNP-C  Lauree Chandler, NP  Patient Care Team: Lauree Chandler, NP as PCP - General (Geriatric Medicine)  Extended Emergency Contact Information Primary Emergency Contact: [facility] Address: [address]           [place], [place] [address] Johnnette Litter of [place] Phone: [phone] Mobile Phone: [phone] Relation: Daughter  Code Status: Full Code  Goals of care: Advanced Directive information Advanced Directives 10/30/2018  Does Patient Have a Medical Advance Directive? No  Would patient like information on creating a medical advance directive? No - Patient declined     Chief Complaint  Patient presents with  . Medical Management of Chronic Issues    2 week follow up blood pressure   . Quality Metric Gaps    Flu Shot and Pneumonia vaccine in office today, Hepatitis C Screening, Colonoscopy, Breast Screening     HPI:  Pt is a 68 y.o. female seen today for an acute visit for 2 weeks follow up high  blood pressure.she started on Amlodipine 2 weeks ago for high blood pressure.she states didn't check blood pressure at home.stays busy and stressed watching her grandchildren including one who is Autistic.Her blood pressure today has improved compared to previous visit.she denies any headache,dizziness,palpitation,chest pain or shortness of breath.   Immunization - she is due for her influenza and Pneumonia vaccine.she denies any signs or symptoms of upper respiratory infections.  Also due for colonoscopy.she does not want to do colonoscopy but has a cologuard kit.encouraged to mail kit. She due for fasting lab work today.  Past Medical History:  Diagnosis Date  . Anxiety   . Arthritis   . Benign essential HTN 08/04/2015  . Bilateral knee pain    With Osteoarthritis, Per records received from [facility]   . Depression   . Heart murmur    hx of   . Hypercholesterolemia    Per [facility] received [facility]   .  Hyperlipidemia   . Hypertension   . Major depressive episode    Per records received from [facility]   . Obesity    Per records received from [facility]   . Plantar fasciitis    Per records received from [facility]   . PONV (postoperative nausea and vomiting)    1 time   Past Surgical History:  Procedure Laterality Date  . 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 TOTAL HIP ARTHROPLASTY Right 03/19/2015   Procedure: TOTAL HIP ARTHROPLASTY ANTERIOR APPROACH;  Surgeon: Gaynelle Arabian, MD;  Location: [facility];  Service: Orthopedics;  Laterality: Right;    No Known Allergies  Outpatient Encounter Medications as of 03/06/2019  Medication Sig  . acetaminophen (TYLENOL) 500 MG tablet Take 500 mg by mouth every 6 (six) hours as needed.  Marland Kitchen amLODipine (NORVASC) 5 MG tablet Take 1 tablet (5 mg total) by mouth daily.  Marland Kitchen atorvastatin (LIPITOR) 40 MG tablet TAKE 1 TABLET(40 MG) BY MOUTH DAILY IN THE EVENING  . Cetirizine HCl 10 MG CAPS Take 1 capsule by mouth as needed.   Marland Kitchen escitalopram (LEXAPRO) 10 MG tablet Take 1 tablet (10 mg total) by mouth daily.  Marland Kitchen lisinopril-hydrochlorothiazide (ZESTORETIC) 20-12.5 MG tablet Take 2 tablets by mouth every evening.  . [DISCONTINUED] atorvastatin (LIPITOR) 40 MG tablet TAKE 1 TABLET(40 MG) BY MOUTH DAILY IN THE EVENING  . [  DISCONTINUED] escitalopram (LEXAPRO) 10 MG tablet Take 1 tablet (10 mg total) by mouth daily.   No facility-administered encounter medications on file as of 03/06/2019.     Review of Systems  Constitutional: Negative for appetite change, chills, fatigue and fever.  HENT: Negative for congestion, rhinorrhea, sinus pressure, sinus pain, sneezing and sore throat.   Eyes: Negative for discharge, redness, itching and visual disturbance.  Respiratory: Negative for cough, chest  tightness, shortness of breath and wheezing.   Cardiovascular: Negative for chest pain, palpitations and leg swelling.  Gastrointestinal: Negative for abdominal distention, abdominal pain, constipation, diarrhea, nausea and vomiting.  Endocrine: Negative for cold intolerance, heat intolerance, polydipsia, polyphagia and polyuria.  Genitourinary: Negative for decreased urine volume, difficulty urinating, dysuria, flank pain, frequency and urgency.  Musculoskeletal: Negative for arthralgias and back pain.  Skin: Negative for color change, pallor and rash.  Neurological: Negative for dizziness, weakness, light-headedness, numbness and headaches.  Psychiatric/Behavioral: Negative for agitation and sleep disturbance. The patient is not nervous/anxious.     Immunization History  Administered Date(s) Administered  . Pneumococcal Conjugate-13 11/12/2016   Pertinent  Health Maintenance Due  Topic Date Due  . MAMMOGRAM  09/01/2001  . COLONOSCOPY  09/01/2001  . DEXA SCAN  09/01/2016  . PNA vac Low Risk Adult (2 of 2 - PPSV23) 11/12/2017  . INFLUENZA VACCINE  12/02/2018   Fall Risk  03/06/2019 01/30/2019 10/30/2018 09/26/2018  Falls in the past year? 0 0 0 1  Number falls in past yr: 0 0 0 0  Injury with Fall? 0 0 0 0    Vitals:   03/06/19 0910  BP: 140/80  Pulse: 74  Temp: (!) 97.1 F (36.2 C)  TempSrc: Temporal  SpO2: 98%  Weight: 230 lb 3.2 oz (104.4 kg)  Height: '5\' 3"'  (1.6 m)   Body mass index is 40.78 kg/m. Physical Exam Constitutional:      General: She is not in acute distress.    Appearance: She is obese. She is not ill-appearing.  HENT:     Head: Normocephalic.     Mouth/Throat:     Mouth: Mucous membranes are moist.     Pharynx: Oropharynx is clear. No oropharyngeal exudate or posterior oropharyngeal erythema.  Eyes:     General: No scleral icterus.       Right eye: No discharge.        Left eye: No discharge.     Extraocular Movements: Extraocular movements intact.      Conjunctiva/sclera: Conjunctivae normal.     Pupils: Pupils are equal, round, and reactive to light.  Neck:     Musculoskeletal: Normal range of motion. No neck rigidity or muscular tenderness.     Vascular: No carotid bruit.  Cardiovascular:     Rate and Rhythm: Normal rate and regular rhythm.     Pulses: Normal pulses.     Heart sounds: Normal heart sounds. No murmur. No friction rub. No gallop.   Pulmonary:     Effort: Pulmonary effort is normal. No respiratory distress.     Breath sounds: Normal breath sounds. No wheezing, rhonchi or rales.  Chest:     Chest wall: No tenderness.  Abdominal:     General: Bowel sounds are normal. There is no distension.     Palpations: Abdomen is soft. There is no mass.     Tenderness: There is no abdominal tenderness. There is no right CVA tenderness, left CVA tenderness, guarding or rebound.  Musculoskeletal: Normal range of motion.  General: No swelling or tenderness.     Right lower leg: No edema.  Lymphadenopathy:     Cervical: No cervical adenopathy.  Skin:    General: Skin is warm and dry.     Coloration: Skin is not pale.     Findings: No bruising or erythema.  Neurological:     Mental Status: She is alert and oriented to person, place, and time.     Cranial Nerves: No cranial nerve deficit.     Sensory: No sensory deficit.     Motor: No weakness.     Gait: Gait normal.  Psychiatric:        Mood and Affect: Mood normal.        Behavior: Behavior normal.        Thought Content: Thought content normal.        Judgment: Judgment normal.    Labs reviewed: Recent Labs    08/18/18 0927  NA 140  K 4.1  CL 104  CO2 28  GLUCOSE 85  BUN 14  CREATININE 0.67  CALCIUM 9.5   Recent Labs    08/18/18 0927  AST 23  ALT 17  BILITOT 0.6  PROT 7.1   Recent Labs    08/18/18 0927  WBC 9.1  NEUTROABS 5,478  HGB 12.8  HCT 38.6  MCV 86.2  PLT 330   Lab Results  Component Value Date   TSH 1.00 01/16/2016   No  results found for: HGBA1C Lab Results  Component Value Date   CHOL 164 08/18/2018   HDL 66 08/18/2018   LDLCALC 83 08/18/2018   TRIG 72 08/18/2018   CHOLHDL 2.5 08/18/2018    Significant Diagnostic Results in last 30 days:  No results found.  Assessment/Plan 1. Mixed hyperlipidemia Latest LDL 83 continue on current medication. - atorvastatin (LIPITOR) 40 MG tablet; TAKE 1 TABLET(40 MG) BY MOUTH DAILY IN THE EVENING  Dispense: 90 tablet; Refill: 1 - Lipid panel  2. Anxiety Stable.refill lexapro. - escitalopram (LEXAPRO) 10 MG tablet; Take 1 tablet (10 mg total) by mouth daily.  Dispense: 90 tablet; Refill: 1  3. Depression, major, single episode, complete remission (HCC) Mood stable.continue on Lexapro 10 mg tablet daily.encouraged to include exercise to relief stress level from the grandchildren.  - escitalopram (LEXAPRO) 10 MG tablet; Take 1 tablet (10 mg total) by mouth daily.  Dispense: 90 tablet; Refill: 1 - TSH level   4. Essential hypertension B/p has improved compared to previous visit.will continue on Amlodpine 5 mg tablet daily Zestoretic 20- 12.5 mg tablet daily and Statin.  - CBC with Differential/Platelet - CMP with eGFR([facility]) - Lipid Panel  5. Need for influenza vaccination Afebrile.No signs or symptoms of URI's. - Flu Vaccine QUAD High Dose(Fluad) administered by CMA   6. Need for 23-polyvalent pneumococcal polysaccharide vaccine Afebrile. - Pneumococcal polysaccharide vaccine 23-valent greater than or equal to 2yo subcutaneous/IM administered by CMA   7. Need for hepatitis C screening test Low risk.  - Hep C Antibody  Family/ staff Communication: Reviewed plan of care with patient.   Labs/tests ordered: - CBC with Differential/Platelet - CMP with eGFR([facility]) - Lipid Panel - TSH level Next appt: 6 months for medical management of chronic issues.    Sandrea Hughs, NP

## 2019-03-07 LAB — CBC WITH DIFFERENTIAL/PLATELET
Absolute Monocytes: 519 cells/uL (ref 200–950)
Basophils Absolute: 73 cells/uL (ref 0–200)
Basophils Relative: 0.8 %
Eosinophils Absolute: 82 cells/uL (ref 15–500)
Eosinophils Relative: 0.9 %
HCT: 42.2 % (ref 35.0–45.0)
Hemoglobin: 14 g/dL (ref 11.7–15.5)
Lymphs Abs: 2821 cells/uL (ref 850–3900)
MCH: 29 pg (ref 27.0–33.0)
MCHC: 33.2 g/dL (ref 32.0–36.0)
MCV: 87.6 fL (ref 80.0–100.0)
MPV: 10.9 fL (ref 7.5–12.5)
Monocytes Relative: 5.7 %
Neutro Abs: 5606 cells/uL (ref 1500–7800)
Neutrophils Relative %: 61.6 %
Platelets: 337 10*3/uL (ref 140–400)
RBC: 4.82 10*6/uL (ref 3.80–5.10)
RDW: 13.1 % (ref 11.0–15.0)
Total Lymphocyte: 31 %
WBC: 9.1 10*3/uL (ref 3.8–10.8)

## 2019-03-07 LAB — COMPLETE METABOLIC PANEL WITH GFR
AG Ratio: 1.4 (calc) (ref 1.0–2.5)
ALT: 16 U/L (ref 6–29)
AST: 18 U/L (ref 10–35)
Albumin: 4.2 g/dL (ref 3.6–5.1)
Alkaline phosphatase (APISO): 91 U/L (ref 37–153)
BUN: 16 mg/dL (ref 7–25)
CO2: 26 mmol/L (ref 20–32)
Calcium: 10.2 mg/dL (ref 8.6–10.4)
Chloride: 103 mmol/L (ref 98–110)
Creat: 0.54 mg/dL (ref 0.50–0.99)
GFR, Est African American: 113 mL/min/{1.73_m2} (ref >=60)
GFR, Est Non African American: 98 mL/min/{1.73_m2} (ref >=60)
Globulin: 2.9 g/dL (calc) (ref 1.9–3.7)
Glucose, Bld: 92 mg/dL (ref 65–99)
Potassium: 3.6 mmol/L (ref 3.5–5.3)
Sodium: 138 mmol/L (ref 135–146)
Total Bilirubin: 0.7 mg/dL (ref 0.2–1.2)
Total Protein: 7.1 g/dL (ref 6.1–8.1)

## 2019-03-07 LAB — HEPATITIS C ANTIBODY
Hepatitis C Ab: NONREACTIVE
SIGNAL TO CUT-OFF: 0.01 (ref <1.00)

## 2019-03-07 LAB — TSH: TSH: 1.2 mIU/L (ref 0.40–4.50)

## 2019-03-07 LAB — LIPID PANEL
Cholesterol: 171 mg/dL (ref <200)
HDL: 53 mg/dL (ref >=50)
LDL Cholesterol (Calc): 95 mg/dL (calc)
Non-HDL Cholesterol (Calc): 118 mg/dL (calc) (ref <130)
Total CHOL/HDL Ratio: 3.2 (calc) (ref <5.0)
Triglycerides: 129 mg/dL (ref <150)

## 2019-04-02 ENCOUNTER — Other Ambulatory Visit: Payer: Self-pay | Admitting: Family

## 2019-04-02 DIAGNOSIS — I10 Essential (primary) hypertension: Secondary | ICD-10-CM

## 2019-05-23 ENCOUNTER — Telehealth: Payer: Self-pay

## 2019-05-23 NOTE — Telephone Encounter (Signed)
Called patient and discussed cologuard kit. Patient states she still has kit but has not submitted. Informed patient if she would like to still submit kit she would need to do so before 09/26/19. Patient states she will work on getting that submitted soon.   Routing to provider to inform.

## 2019-05-23 NOTE — Telephone Encounter (Signed)
Noted  

## 2019-08-15 ENCOUNTER — Other Ambulatory Visit: Payer: Self-pay | Admitting: Family

## 2019-08-15 DIAGNOSIS — F419 Anxiety disorder, unspecified: Secondary | ICD-10-CM

## 2019-08-15 DIAGNOSIS — F325 Major depressive disorder, single episode, in full remission: Secondary | ICD-10-CM

## 2019-08-15 DIAGNOSIS — E782 Mixed hyperlipidemia: Secondary | ICD-10-CM

## 2019-08-15 DIAGNOSIS — I10 Essential (primary) hypertension: Secondary | ICD-10-CM

## 2019-09-03 ENCOUNTER — Ambulatory Visit: Payer: Medicare Other | Admitting: Family

## 2019-09-10 ENCOUNTER — Ambulatory Visit: Payer: Medicare Other | Admitting: Family

## 2019-09-26 ENCOUNTER — Encounter: Payer: Self-pay | Admitting: Nurse Practitioner

## 2019-09-26 LAB — COLOGUARD

## 2019-10-15 ENCOUNTER — Other Ambulatory Visit: Payer: Self-pay

## 2019-10-15 ENCOUNTER — Ambulatory Visit (INDEPENDENT_AMBULATORY_CARE_PROVIDER_SITE_OTHER): Payer: Medicare Other | Admitting: Family

## 2019-10-15 ENCOUNTER — Encounter: Payer: Self-pay | Admitting: Family

## 2019-10-15 VITALS — BP 150/90 | HR 78 | Temp 97.1°F | Resp 18 | Ht 63.0 in | Wt 235.2 lb

## 2019-10-15 DIAGNOSIS — F325 Major depressive disorder, single episode, in full remission: Secondary | ICD-10-CM | POA: Diagnosis not present

## 2019-10-15 DIAGNOSIS — E782 Mixed hyperlipidemia: Secondary | ICD-10-CM

## 2019-10-15 DIAGNOSIS — H6123 Impacted cerumen, bilateral: Secondary | ICD-10-CM | POA: Diagnosis not present

## 2019-10-15 DIAGNOSIS — Z23 Encounter for immunization: Secondary | ICD-10-CM

## 2019-10-15 DIAGNOSIS — E2839 Other primary ovarian failure: Secondary | ICD-10-CM

## 2019-10-15 DIAGNOSIS — J302 Other seasonal allergic rhinitis: Secondary | ICD-10-CM | POA: Insufficient documentation

## 2019-10-15 DIAGNOSIS — I10 Essential (primary) hypertension: Secondary | ICD-10-CM | POA: Diagnosis not present

## 2019-10-15 DIAGNOSIS — Z6841 Body Mass Index (BMI) 40.0 and over, adult: Secondary | ICD-10-CM

## 2019-10-15 DIAGNOSIS — Z1231 Encounter for screening mammogram for malignant neoplasm of breast: Secondary | ICD-10-CM

## 2019-10-15 DIAGNOSIS — M159 Polyosteoarthritis, unspecified: Secondary | ICD-10-CM

## 2019-10-15 DIAGNOSIS — M8949 Other hypertrophic osteoarthropathy, multiple sites: Secondary | ICD-10-CM

## 2019-10-15 DIAGNOSIS — F419 Anxiety disorder, unspecified: Secondary | ICD-10-CM

## 2019-10-15 MED ORDER — TETANUS-DIPHTH-ACELL PERTUSSIS 5-2.5-18.5 LF-MCG/0.5 IM SUSP: 0.5000 mL | Freq: Once | INTRAMUSCULAR | 0 refills | Status: AC

## 2019-10-15 MED ORDER — ATORVASTATIN CALCIUM 40 MG PO TABS: 40.0000 mg | ORAL_TABLET | Freq: Every day | ORAL | 1 refills | Status: DC

## 2019-10-15 MED ORDER — AMLODIPINE BESYLATE 5 MG PO TABS: 5.0000 mg | ORAL_TABLET | Freq: Every day | ORAL | 3 refills | Status: DC

## 2019-10-15 MED ORDER — LISINOPRIL-HYDROCHLOROTHIAZIDE 20-12.5 MG PO TABS: 2.0000 | ORAL_TABLET | Freq: Every day | ORAL | 0 refills | Status: DC

## 2019-10-15 MED ORDER — ESCITALOPRAM OXALATE 20 MG PO TABS: 20.0000 mg | ORAL_TABLET | Freq: Every day | ORAL | 1 refills | Status: DC

## 2019-10-15 NOTE — Progress Notes (Addendum)
Provider: Marye Eagen FNP-C   [name], NP  Patient Care Team: [name], NP as PCP - General (Geriatric Medicine)  Extended Emergency Contact Information Primary Emergency Contact: [facility] Address: [address]           [place], [place] [address] [place] of [place] Home Phone: [phone] Mobile Phone: [phone] Relation: Daughter  Code Status: Full Code  Goals of care: Advanced Directive information Advanced Directives 10/15/2019  Does Patient Have a Medical Advance Directive? No  Does patient want to make changes to medical advance directive? No - Patient declined  Would patient like information on creating a medical advance directive? -     Chief Complaint  Patient presents with  . Medication Refill    Refill on medications.    HPI:  Pt is a 69 y.o. female seen today for medical management of chronic diseases.she request all her medication refilled.she missed her follow appointment visit  She has gain some weight.eats fried food and bacon.states stays active taking care grandchildren. Larey Seat one night going to the car to get groceries on the parking lot was assisted by people on the parking lot.she went down on her knees.  Hypertension - B/p high this morning at home 170/90 -160/100 when coming here.on Amlodipine 5 mg tablet and lisinopril -HZCT 20 -12.5 mg tablet daily.states run out of her Lisinopril- HZCT tablets request refill.she denies any signs of headache,dizziness,palpitation or shortness of breath.   Hyperlipidemia - on Atorvastatin 40 daily.states aware what she needs to be eating but just need to change but feels so stressed caring for two kids with disability.  Depression /Anxiety - escitalopram 10 mg tablet daily.States symptoms not under control.Gets anxiety and easily irritated especially caring for her Grandchildren with special needs.   Past Medical History:  Diagnosis Date  . Anxiety   . Arthritis   . Benign  essential HTN 08/04/2015  . Bilateral knee pain    With Osteoarthritis, Per records received from [facility]   . Depression   . Heart murmur    hx of   . Hypercholesterolemia    Per records received from [facility]   . Hyperlipidemia   . Hypertension   . Major depressive episode    Per records received from [facility]   . Obesity    Per records received from [facility]   . Plantar fasciitis    Per records received from [facility]   . PONV (postoperative nausea and vomiting)    1 time   Past Surgical History:  Procedure Laterality Date  . 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 TOTAL HIP ARTHROPLASTY Right 03/19/2015   Procedure: TOTAL HIP ARTHROPLASTY ANTERIOR APPROACH;  Surgeon: Ollen Gross, MD;  Location: [facility];  Service: Orthopedics;  Laterality: Right;    No Known Allergies  Allergies as of 10/15/2019   No Known Allergies     Medication List       Accurate as of October 15, 2019  9:54 AM. If you have any questions, ask your nurse or doctor.        acetaminophen 500 MG tablet Commonly known as: TYLENOL Take 500 mg by mouth every 6 (six) hours as needed.   amLODipine 5 MG tablet Commonly known as: NORVASC TAKE 1 TABLET BY MOUTH  DAILY   atorvastatin 40 MG tablet  Commonly known as: LIPITOR Take 1 tablet (40 mg total) by mouth daily.   Cetirizine HCl 10 MG Caps Take 1 capsule by mouth as needed.   escitalopram 10 MG tablet Commonly known as: LEXAPRO Take 1 tablet (10 mg total) by mouth daily.   lisinopril-hydrochlorothiazide 20-12.5 MG tablet Commonly known as: ZESTORETIC Take 2 tablets by mouth daily.       Review of Systems  Constitutional: Negative for appetite change, chills, fatigue and fever.  HENT: Positive for sneezing. Negative for congestion, hearing loss, sinus  pressure, sinus pain, sore throat, tinnitus and trouble swallowing.        Zyrtec effective.   Eyes: Negative for pain, discharge, redness, itching and visual disturbance.  Respiratory: Negative for cough, chest tightness, shortness of breath and wheezing.   Cardiovascular: Negative for chest pain, palpitations and leg swelling.  Gastrointestinal: Negative for abdominal distention, abdominal pain, constipation, diarrhea, nausea and vomiting.  Endocrine: Negative for cold intolerance, heat intolerance, polydipsia, polyphagia and polyuria.  Genitourinary: Negative for decreased urine volume, difficulty urinating, dysuria, flank pain, frequency and urgency.  Musculoskeletal: Positive for arthralgias. Negative for back pain, gait problem, joint swelling and myalgias.  Skin: Negative for color change, pallor and rash.  Neurological: Negative for dizziness, seizures, speech difficulty, weakness, light-headedness, numbness and headaches.  Hematological: Does not bruise/bleed easily.  Psychiatric/Behavioral: Negative for agitation, self-injury and suicidal ideas. The patient is nervous/anxious.        Sleeps at midnight     Immunization History  Administered Date(s) Administered  . Fluad Quad(high Dose 65+) 03/06/2019  . Pneumococcal Conjugate-13 11/12/2016  . Pneumococcal Polysaccharide-23 03/06/2019   Pertinent  Health Maintenance Due  Topic Date Due  . MAMMOGRAM  Never done  . COLONOSCOPY  Never done  . DEXA SCAN  Never done  . INFLUENZA VACCINE  12/02/2019  . PNA vac Low Risk Adult  Completed   Fall Risk  10/15/2019 03/06/2019 01/30/2019 10/30/2018 09/26/2018  Falls in the past year? 1 0 0 0 1  Number falls in past yr: 0 0 0 0 0  Injury with Fall? 1 0 0 0 0    Vitals:   10/15/19 0938  BP: (!) 150/90  Pulse: 78  Resp: 18  Temp: (!) 97.1 F (36.2 C)  SpO2: 97%  Weight: 235 lb 3.2 oz (106.7 kg)  Height: 5\' 3"  (1.6 m)   Body mass index is 41.66 kg/m. Physical Exam Vitals  reviewed.  Constitutional:      General: She is not in acute distress.    Appearance: She is obese. She is not ill-appearing.  HENT:     Head: Normocephalic.     Right Ear: There is impacted cerumen.     Left Ear: There is impacted cerumen.     Ears:     Comments: Bilateral ear cerumen impaction lavaged with warm water and hydrogen peroxide moderate amounts of cerumen obtained.Alligator forcep instrument used on right ear.Tolerated procedure well.TM clear.     Nose: Nose normal. No congestion or rhinorrhea.     Mouth/Throat:     Mouth: Mucous membranes are moist.     Pharynx: Oropharynx is clear. No oropharyngeal exudate or posterior oropharyngeal erythema.  Eyes:     General: No scleral icterus.       Right eye: No discharge.        Left eye: No discharge.     Extraocular Movements: Extraocular movements intact.     Conjunctiva/sclera: Conjunctivae normal.     Pupils:  Pupils are equal, round, and reactive to light.  Neck:     Vascular: No carotid bruit.  Cardiovascular:     Rate and Rhythm: Normal rate and regular rhythm.     Pulses: Normal pulses.     Heart sounds: Normal heart sounds. No murmur heard.  No friction rub. No gallop.   Pulmonary:     Effort: Pulmonary effort is normal. No respiratory distress.     Breath sounds: Normal breath sounds. No wheezing, rhonchi or rales.  Chest:     Chest wall: No tenderness.  Abdominal:     General: Bowel sounds are normal. There is no distension.     Palpations: Abdomen is soft. There is no mass.     Tenderness: There is no abdominal tenderness. There is no right CVA tenderness, left CVA tenderness, guarding or rebound.  Musculoskeletal:        General: No swelling or tenderness. Normal range of motion.     Cervical back: Normal range of motion. No rigidity or tenderness.     Right lower leg: No edema.     Left lower leg: No edema.  Lymphadenopathy:     Cervical: No cervical adenopathy.  Skin:    General: Skin is warm.      Coloration: Skin is not pale.     Findings: No bruising, erythema or rash.  Neurological:     Mental Status: She is alert and oriented to person, place, and time.     Cranial Nerves: No cranial nerve deficit.     Sensory: No sensory deficit.     Motor: No weakness.     Coordination: Coordination normal.     Gait: Gait normal.  Psychiatric:        Mood and Affect: Mood normal.        Behavior: Behavior normal.        Thought Content: Thought content normal.        Judgment: Judgment normal.    Labs reviewed: Recent Labs    03/06/19 0954  NA 138  K 3.6  CL 103  CO2 26  GLUCOSE 92  BUN 16  CREATININE 0.54  CALCIUM 10.2   Recent Labs    03/06/19 0954  AST 18  ALT 16  BILITOT 0.7  PROT 7.1   Recent Labs    03/06/19 0954  WBC 9.1  NEUTROABS 5,606  HGB 14.0  HCT 42.2  MCV 87.6  PLT 337   Lab Results  Component Value Date   TSH 1.20 03/06/2019   No results found for: HGBA1C Lab Results  Component Value Date   CHOL 171 03/06/2019   HDL 53 03/06/2019   LDLCALC 95 03/06/2019   TRIG 129 03/06/2019   CHOLHDL 3.2 03/06/2019    Significant Diagnostic Results in last 30 days:  No results found.  Assessment/Plan 1. Essential hypertension B/p readings high though run out of her Lisinopril/HCZT missed several office appointment.will refill medication today - advised to check B/p at home and notify provider if B/p > 140/90.Additional education information for weight loss provided on AVS.Has gained 5 lbs since previous visit 7 months ago.  - amLODipine (NORVASC) 5 MG tablet; Take 1 tablet (5 mg total) by mouth daily.  Dispense: 90 tablet; Refill: 3 - lisinopril-hydrochlorothiazide (ZESTORETIC) 20-12.5 MG tablet; Take 2 tablets by mouth daily.  Dispense: 60 tablet; Refill: 0  2. Mixed hyperlipidemia Dietary and life style modification discussed though stays active during the day caring for two disabled grandchildren.continue on  Atorvastatin.  - atorvastatin  (LIPITOR) 40 MG tablet; Take 1 tablet (40 mg total) by mouth daily.  Dispense: 90 tablet; Refill: 1   3. Depression, major, single episode, complete remission (HCC) States easily irritable thinks lexapro effective but needs higher dose due to her stress level dealing with her two disabled grandchildren while the daughter is at work.will increase lexapro from 10 mg to 20 mg tablet daily.Advised to notify provider if symptoms worsen or fail to improve.   - escitalopram (LEXAPRO) 20 MG tablet; Take 1 tablet (20 mg total) by mouth daily.  Dispense: 90 tablet; Refill: 1  4. Anxiety  Easily irritable as above.Adjust lexapro to 20 mg tablet daily.  - escitalopram (LEXAPRO) 20 MG tablet; Take 1 tablet (20 mg total) by mouth daily.  Dispense: 90 tablet; Refill: 1  5. Primary osteoarthritis involving multiple joints Continue on tylenol 500 mg tablet every 6 hrs as needed for pain.encouraged to exercise daily.  6. Seasonal allergies Continue on cetirizine 10 mg tablet as needed   7. Bilateral impacted cerumen bilateral ear lavaged with warm water and hydrogen peroxide moderate amounts of cerumen removed.tolerated procedure well.Alligator forcep instrument used on right ear..TM clear.  9. Body mass index (BMI) of 40.1-44.9 in adult (HCC) BMI 41.66 Dietary modification and exercise advised.   10. Estrogen deficiency Due for bone density.No fall episode. - DG Bone Density; Future  11. Encounter for screening mammogram for breast cancer Reports no symptoms. - MM Digital Screening; Future  12. Need for Tdap vaccination Tdap vaccine script send to her pharmacy in the past but did go to pharmacy to get vaccine.Advised to get Tetanus vaccine at her pharmacy verbalized understanding.   - Tdap (BOOSTRIX) 5-2.5-18.5 LF-MCG/0.5 injection; Inject 0.5 mLs into the muscle once for 1 dose.  Dispense: 0.5 mL; Refill: 0  Family/ staff Communication: Reviewed plan of care with patient verbalized  understanding.  Labs/tests ordered: - DG Bone Density; Future - MM Digital Screening; Future  Next Appointment : 6 months for medical management of chronic issues.  Caesar Bookman, NP

## 2019-10-15 NOTE — Patient Instructions (Addendum)
- Please get your Tetanus vaccine at your pharmacy   Calorie Counting for Weight Loss Calories are units of energy. Your body needs a certain amount of calories from food to keep you going throughout the day. When you eat more calories than your body needs, your body stores the extra calories as fat. When you eat fewer calories than your body needs, your body burns fat to get the energy it needs. Calorie counting means keeping track of how many calories you eat and drink each day. Calorie counting can be helpful if you need to lose weight. If you make sure to eat fewer calories than your body needs, you should lose weight. Ask your health care provider what a healthy weight is for you. For calorie counting to work, you will need to eat the right number of calories in a day in order to lose a healthy amount of weight per week. A dietitian can help you determine how many calories you need in a day and will give you suggestions on how to reach your calorie goal.  A healthy amount of weight to lose per week is usually 1-2 lb (0.5-0.9 kg). This usually means that your daily calorie intake should be reduced by 500-750 calories.  Eating 1,200 - 1,500 calories per day can help most women lose weight.  Eating 1,500 - 1,800 calories per day can help most men lose weight. What is my plan? My goal is to have __________ calories per day. If I have this many calories per day, I should lose around __________ pounds per week. What do I need to know about calorie counting? In order to meet your daily calorie goal, you will need to:  Find out how many calories are in each food you would like to eat. Try to do this before you eat.  Decide how much of the food you plan to eat.  Write down what you ate and how many calories it had. Doing this is called keeping a food log. To successfully lose weight, it is important to balance calorie counting with a healthy lifestyle that includes regular activity. Aim for 150  minutes of moderate exercise (such as walking) or 75 minutes of vigorous exercise (such as running) each week. Where do I find calorie information?  The number of calories in a food can be found on a Nutrition Facts label. If a food does not have a Nutrition Facts label, try to look up the calories online or ask your dietitian for help. Remember that calories are listed per serving. If you choose to have more than one serving of a food, you will have to multiply the calories per serving by the amount of servings you plan to eat. For example, the label on a package of bread might say that a serving size is 1 slice and that there are 90 calories in a serving. If you eat 1 slice, you will have eaten 90 calories. If you eat 2 slices, you will have eaten 180 calories. How do I keep a food log? Immediately after each meal, record the following information in your food log:  What you ate. Don't forget to include toppings, sauces, and other extras on the food.  How much you ate. This can be measured in cups, ounces, or number of items.  How many calories each food and drink had.  The total number of calories in the meal. Keep your food log near you, such as in a small notebook in your pocket,  or use a mobile app or website. Some programs will calculate calories for you and show you how many calories you have left for the day to meet your goal. What are some calorie counting tips?   Use your calories on foods and drinks that will fill you up and not leave you hungry: ? Some examples of foods that fill you up are nuts and nut butters, vegetables, lean proteins, and high-fiber foods like whole grains. High-fiber foods are foods with more than 5 g fiber per serving. ? Drinks such as sodas, specialty coffee drinks, alcohol, and juices have a lot of calories, yet do not fill you up.  Eat nutritious foods and avoid empty calories. Empty calories are calories you get from foods or beverages that do not have  many vitamins or protein, such as candy, sweets, and soda. It is better to have a nutritious high-calorie food (such as an avocado) than a food with few nutrients (such as a bag of chips).  Know how many calories are in the foods you eat most often. This will help you calculate calorie counts faster.  Pay attention to calories in drinks. Low-calorie drinks include water and unsweetened drinks.  Pay attention to nutrition labels for "low fat" or "fat free" foods. These foods sometimes have the same amount of calories or more calories than the full fat versions. They also often have added sugar, starch, or salt, to make up for flavor that was removed with the fat.  Find a way of tracking calories that works for you. Get creative. Try different apps or programs if writing down calories does not work for you. What are some portion control tips?  Know how many calories are in a serving. This will help you know how many servings of a certain food you can have.  Use a measuring cup to measure serving sizes. You could also try weighing out portions on a kitchen scale. With time, you will be able to estimate serving sizes for some foods.  Take some time to put servings of different foods on your favorite plates, bowls, and cups so you know what a serving looks like.  Try not to eat straight from a bag or box. Doing this can lead to overeating. Put the amount you would like to eat in a cup or on a plate to make sure you are eating the right portion.  Use smaller plates, glasses, and bowls to prevent overeating.  Try not to multitask (for example, watch TV or use your computer) while eating. If it is time to eat, sit down at a table and enjoy your food. This will help you to know when you are full. It will also help you to be aware of what you are eating and how much you are eating. What are tips for following this plan? Reading food labels  Check the calorie count compared to the serving size. The  serving size may be smaller than what you are used to eating.  Check the source of the calories. Make sure the food you are eating is high in vitamins and protein and low in saturated and trans fats. Shopping  Read nutrition labels while you shop. This will help you make healthy decisions before you decide to purchase your food.  Make a grocery list and stick to it. Cooking  Try to cook your favorite foods in a healthier way. For example, try baking instead of frying.  Use low-fat dairy products. Meal planning  Use  more fruits and vegetables. Half of your plate should be fruits and vegetables.  Include lean proteins like poultry and fish. How do I count calories when eating out?  Ask for smaller portion sizes.  Consider sharing an entree and sides instead of getting your own entree.  If you get your own entree, eat only half. Ask for a box at the beginning of your meal and put the rest of your entree in it so you are not tempted to eat it.  If calories are listed on the menu, choose the lower calorie options.  Choose dishes that include vegetables, fruits, whole grains, low-fat dairy products, and lean protein.  Choose items that are boiled, broiled, grilled, or steamed. Stay away from items that are buttered, battered, fried, or served with cream sauce. Items labeled "crispy" are usually fried, unless stated otherwise.  Choose water, low-fat milk, unsweetened iced tea, or other drinks without added sugar. If you want an alcoholic beverage, choose a lower calorie option such as a glass of wine or light beer.  Ask for dressings, sauces, and syrups on the side. These are usually high in calories, so you should limit the amount you eat.  If you want a salad, choose a garden salad and ask for grilled meats. Avoid extra toppings like bacon, cheese, or fried items. Ask for the dressing on the side, or ask for olive oil and vinegar or lemon to use as dressing.  Estimate how many  servings of a food you are given. For example, a serving of cooked rice is  cup or about the size of half a baseball. Knowing serving sizes will help you be aware of how much food you are eating at restaurants. The list below tells you how big or small some common portion sizes are based on everyday objects: ? 1 oz--4 stacked dice. ? 3 oz--1 deck of cards. ? 1 tsp--1 die. ? 1 Tbsp-- a ping-pong ball. ? 2 Tbsp--1 ping-pong ball. ?  cup-- baseball. ? 1 cup--1 baseball. Summary  Calorie counting means keeping track of how many calories you eat and drink each day. If you eat fewer calories than your body needs, you should lose weight.  A healthy amount of weight to lose per week is usually 1-2 lb (0.5-0.9 kg). This usually means reducing your daily calorie intake by 500-750 calories.  The number of calories in a food can be found on a Nutrition Facts label. If a food does not have a Nutrition Facts label, try to look up the calories online or ask your dietitian for help.  Use your calories on foods and drinks that will fill you up, and not on foods and drinks that will leave you hungry.  Use smaller plates, glasses, and bowls to prevent overeating. This information is not intended to replace advice given to you by your health care provider. Make sure you discuss any questions you have with your health care provider. Document Revised: 01/06/2018 Document Reviewed: 03/19/2016 Elsevier Patient Education  [place].

## 2019-10-19 ENCOUNTER — Other Ambulatory Visit: Payer: Self-pay

## 2019-10-19 ENCOUNTER — Other Ambulatory Visit: Payer: Medicare Other

## 2019-10-19 DIAGNOSIS — I10 Essential (primary) hypertension: Secondary | ICD-10-CM

## 2019-10-19 DIAGNOSIS — E782 Mixed hyperlipidemia: Secondary | ICD-10-CM

## 2019-11-13 ENCOUNTER — Other Ambulatory Visit: Payer: Self-pay | Admitting: Family

## 2019-11-13 DIAGNOSIS — I10 Essential (primary) hypertension: Secondary | ICD-10-CM

## 2019-11-28 DIAGNOSIS — H52223 Regular astigmatism, bilateral: Secondary | ICD-10-CM | POA: Diagnosis not present

## 2020-03-03 ENCOUNTER — Telehealth: Payer: Self-pay

## 2020-03-03 NOTE — Telephone Encounter (Signed)
Tried calling patient to schedule AWV, but I think someone answered the phone then hung up. Patient needs to schedule AWV with Abbey Chatters, NP. Last one was done 09/26/2018.

## 2020-03-11 ENCOUNTER — Telehealth: Payer: Self-pay

## 2020-03-11 NOTE — Telephone Encounter (Signed)
Will need an OV to discuss, I have not seen her recently as she has been following with Dinah.

## 2020-03-11 NOTE — Telephone Encounter (Signed)
Patient stated she has been reading about HCTZ and would like to change to another diuretic due to possible contamination and also being on backorder.

## 2020-03-12 NOTE — Telephone Encounter (Signed)
Called patient to let her know that she needed to be seen to discuss BP medication.  She states she keeps her grandchildren and that she will have to call back after talking with her daughter to see when she can make an appointment. I told her that she could schedule an appointment with anyone.

## 2020-04-18 ENCOUNTER — Ambulatory Visit: Payer: Medicare Other | Admitting: Family

## 2020-04-18 ENCOUNTER — Other Ambulatory Visit: Payer: Self-pay

## 2020-05-05 ENCOUNTER — Other Ambulatory Visit: Payer: Self-pay | Admitting: Family

## 2020-05-05 DIAGNOSIS — F419 Anxiety disorder, unspecified: Secondary | ICD-10-CM

## 2020-05-05 DIAGNOSIS — E782 Mixed hyperlipidemia: Secondary | ICD-10-CM

## 2020-05-05 DIAGNOSIS — F325 Major depressive disorder, single episode, in full remission: Secondary | ICD-10-CM

## 2020-07-07 ENCOUNTER — Telehealth: Payer: Self-pay | Admitting: Family

## 2020-07-07 NOTE — Telephone Encounter (Signed)
I called the patient and their phone is no longer in service

## 2020-07-07 NOTE — Telephone Encounter (Signed)
-----   Message from [name], NP sent at 06/26/2020 10:43 AM EST ----- Dinahs pt Pt is overdue for follow up and AWV Please call to schedule.  Thank you

## 2020-07-15 ENCOUNTER — Other Ambulatory Visit: Payer: Self-pay | Admitting: Nurse Practitioner

## 2020-07-15 DIAGNOSIS — I10 Essential (primary) hypertension: Secondary | ICD-10-CM

## 2020-09-04 ENCOUNTER — Ambulatory Visit: Payer: Self-pay | Admitting: Orthopedic Surgery

## 2020-10-09 ENCOUNTER — Other Ambulatory Visit: Payer: Self-pay | Admitting: Family

## 2020-10-09 DIAGNOSIS — I10 Essential (primary) hypertension: Secondary | ICD-10-CM

## 2020-10-22 ENCOUNTER — Other Ambulatory Visit: Payer: Self-pay | Admitting: Family

## 2020-10-22 DIAGNOSIS — I10 Essential (primary) hypertension: Secondary | ICD-10-CM

## 2020-10-23 NOTE — Telephone Encounter (Signed)
Need to schedule for follow up visit last seen 10/2019

## 2020-11-20 ENCOUNTER — Other Ambulatory Visit: Payer: Self-pay | Admitting: Family

## 2020-11-20 DIAGNOSIS — I10 Essential (primary) hypertension: Secondary | ICD-10-CM

## 2020-12-04 ENCOUNTER — Other Ambulatory Visit: Payer: Self-pay | Admitting: Family

## 2020-12-04 DIAGNOSIS — I10 Essential (primary) hypertension: Secondary | ICD-10-CM

## 2021-02-27 ENCOUNTER — Encounter: Payer: Self-pay | Admitting: Family

## 2021-02-27 ENCOUNTER — Ambulatory Visit: Payer: Medicare Other | Admitting: Family

## 2021-02-27 ENCOUNTER — Other Ambulatory Visit: Payer: Self-pay

## 2021-02-27 ENCOUNTER — Ambulatory Visit (INDEPENDENT_AMBULATORY_CARE_PROVIDER_SITE_OTHER): Payer: Medicare Other | Admitting: Family

## 2021-02-27 VITALS — BP 150/98 | HR 85 | Temp 97.8°F | Resp 18 | Ht 63.0 in | Wt 219.6 lb

## 2021-02-27 DIAGNOSIS — J302 Other seasonal allergic rhinitis: Secondary | ICD-10-CM

## 2021-02-27 DIAGNOSIS — Z23 Encounter for immunization: Secondary | ICD-10-CM

## 2021-02-27 DIAGNOSIS — F325 Major depressive disorder, single episode, in full remission: Secondary | ICD-10-CM | POA: Diagnosis not present

## 2021-02-27 DIAGNOSIS — Z1231 Encounter for screening mammogram for malignant neoplasm of breast: Secondary | ICD-10-CM

## 2021-02-27 DIAGNOSIS — I10 Essential (primary) hypertension: Secondary | ICD-10-CM | POA: Diagnosis not present

## 2021-02-27 DIAGNOSIS — M25561 Pain in right knee: Secondary | ICD-10-CM | POA: Diagnosis not present

## 2021-02-27 DIAGNOSIS — H6123 Impacted cerumen, bilateral: Secondary | ICD-10-CM

## 2021-02-27 DIAGNOSIS — Z1211 Encounter for screening for malignant neoplasm of colon: Secondary | ICD-10-CM

## 2021-02-27 DIAGNOSIS — M159 Polyosteoarthritis, unspecified: Secondary | ICD-10-CM | POA: Diagnosis not present

## 2021-02-27 DIAGNOSIS — Z780 Asymptomatic menopausal state: Secondary | ICD-10-CM | POA: Diagnosis not present

## 2021-02-27 DIAGNOSIS — E782 Mixed hyperlipidemia: Secondary | ICD-10-CM | POA: Diagnosis not present

## 2021-02-27 DIAGNOSIS — G8929 Other chronic pain: Secondary | ICD-10-CM

## 2021-02-27 DIAGNOSIS — M150 Primary generalized (osteo)arthritis: Secondary | ICD-10-CM

## 2021-02-27 MED ORDER — AMLODIPINE BESYLATE 10 MG PO TABS: 10.0000 mg | ORAL_TABLET | Freq: Every day | ORAL | 1 refills | Status: DC

## 2021-02-27 MED ORDER — LISINOPRIL-HYDROCHLOROTHIAZIDE 20-12.5 MG PO TABS: 2.0000 | ORAL_TABLET | Freq: Every day | ORAL | 1 refills | Status: DC

## 2021-02-27 NOTE — Progress Notes (Signed)
Provider: Marlowe Sax FNP-C   Joline Encalada, Nelda Bucks, NP  Patient Care Team: Auriel Kist, Nelda Bucks, NP as PCP - General (Family Medicine)  Extended Emergency Contact Information Primary Emergency Contact: Gaxiola,Nkechi Address: [address]           [place], [place] [address] Johnnette Litter of [place] Phone: [phone] Mobile Phone: [phone] Relation: Daughter  Code Status:  Full Code  Goals of care: Advanced Directive information Advanced Directives 02/27/2021  Does Patient Have a Medical Advance Directive? No  Does patient want to make changes to medical advance directive? -  Would patient like information on creating a medical advance directive? No - Patient declined     Chief Complaint  Patient presents with   Medical Management of Chronic Issues    4 month follow up   Health Maintenance    Discuss the need for Mammogram, Colonoscopy, and Dexa scan.   Immunizations    Discuss the need for Covid vaccine, Shingrix vaccine, Tetanus vaccine, and Influenza vaccine.     HPI:  Pt is a 70 y.o. female seen today for medical management of chronic diseases.   Right knee pain - ongoing x 2 months ago felt like she tripped on her foot since then has had pain.Has taken tylenol with eases the pain.   Depression/Anxiety - medication working good.   Hypertension - elevated today.started checking blood pressure yesterday Home reading 150/90 - 160's/80's.  Has been taking medication as directed denies any headache,dizziness,vision changes,fatigue,chest tightness,palpitation,chest pain or shortness of breath.   Has seen Ophthalmologist one year ago.wears eye glasses.   She is due for tetanus,COVID-19, shingles and Influenza vaccine.   Due also mammogram,Bone density and colonoscopy.     Past Medical History:  Diagnosis Date   Anxiety    Arthritis    Benign essential HTN 08/04/2015   Bilateral knee pain    With Osteoarthritis, Per records received from [place]     Depression    Heart murmur    hx of    Hypercholesterolemia    Per records received from [facility]    Hyperlipidemia    Hypertension    Major depressive episode    Per records received from [facility]    Obesity    Per records received from [place]    Plantar fasciitis    Per records received from [place]    PONV (postoperative nausea and vomiting)    1 time   Past Surgical History:  Procedure Laterality Date   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 If you have any questions, ask your nurse or doctor.          acetaminophen 500 MG tablet Commonly known as: TYLENOL Take 500 mg by mouth every 6 (six) hours as needed.   amLODipine 5 MG tablet Commonly known as: NORVASC TAKE 1 TABLET BY MOUTH  DAILY   atorvastatin 40 MG tablet Commonly known as: LIPITOR TAKE 1  TABLET BY MOUTH  DAILY   Cetirizine HCl 10 MG Caps Take 1 capsule by mouth as needed.   escitalopram 20 MG tablet Commonly known as: LEXAPRO TAKE 1 TABLET BY MOUTH  DAILY   lisinopril-hydrochlorothiazide 20-12.5 MG tablet Commonly known as: ZESTORETIC TAKE 2 TABLETS BY MOUTH  DAILY   Tdap 5-2.5-18.5 LF-MCG/0.5 injection Commonly known as: BOOSTRIX Inject 0.5 mLs into the muscle once.        Review of Systems  Constitutional:  Negative for appetite change, chills, fatigue, fever and unexpected weight change.  HENT:  Negative for congestion, dental problem, ear discharge, ear pain, facial  swelling, hearing loss, nosebleeds, postnasal drip, rhinorrhea, sinus pressure, sinus pain, sneezing, sore throat, tinnitus and trouble swallowing.   Eyes:  Negative for pain, discharge, redness, itching and visual disturbance.  Respiratory:  Negative for cough, chest tightness, shortness of breath and wheezing.   Cardiovascular:  Negative for chest pain, palpitations and leg swelling.  Gastrointestinal:  Negative for abdominal distention, abdominal pain, blood in stool, constipation, diarrhea, nausea and vomiting.  Endocrine: Negative for cold intolerance, heat intolerance, polydipsia, polyphagia and polyuria.  Genitourinary:  Negative for difficulty urinating, dysuria, flank pain, frequency and urgency.  Musculoskeletal:  Positive for arthralgias. Negative for back pain, gait problem, joint swelling, myalgias, neck pain and neck stiffness.  Skin:  Negative for color change, pallor, rash and wound.  Neurological:  Negative for dizziness, syncope, speech difficulty, weakness, light-headedness, numbness and headaches.  Hematological:  Does not bruise/bleed easily.  Psychiatric/Behavioral:  Negative for agitation, behavioral problems, confusion, hallucinations, self-injury, sleep disturbance and suicidal ideas. The patient is not nervous/anxious.    Immunization History  Administered Date(s) Administered   Fluad Quad(high Dose 65+) 03/06/2019   Pneumococcal Conjugate-13 11/12/2016   Pneumococcal Polysaccharide-23 03/06/2019   Pertinent  Health Maintenance Due  Topic Date Due   COLONOSCOPY (Pts 45-46yr Insurance coverage will need to be confirmed)  Never done   MAMMOGRAM  Never done   DEXA SCAN  Never done   INFLUENZA VACCINE  12/01/2020   Fall Risk 10/30/2018 01/30/2019 03/06/2019 10/15/2019 02/27/2021  Falls in the past year? 0 0 0 1 0  Was there an injury with Fall? 0 0 0 1 0  Fall Risk Category Calculator 0 0 0 2 0  Fall Risk Category Low Low Low Moderate Low  Patient Fall Risk Level Low  fall risk Low fall risk Low fall risk Moderate fall risk Low fall risk  Patient at Risk for Falls Due to - - - - No Fall Risks  Fall risk Follow up - - - - Falls evaluation completed   Functional Status Survey:    Vitals:   02/27/21 1329  BP: (!) 180/100  Pulse: 85  Resp: 18  Temp: 97.8 F (36.6 C)  SpO2: 98%  Weight: 219 lb 9.6 oz (99.6 kg)  Height: '5\' 3"'  (1.6 m)   Body mass index is 38.9 kg/m. Physical Exam Vitals reviewed.  Constitutional:      General: She is not in acute distress.    Appearance: Normal appearance. She is normal weight. She is not ill-appearing or diaphoretic.  HENT:     Head: Normocephalic.     Right Ear: There is impacted cerumen.     Left Ear: There is impacted cerumen.     Ears:     Comments: Bilateral ear cerumen lavaged with warm water and hydrogen peroxide moderate amounts of cerumen obtained.No instrument used Tolerated procedure well.TM clear without any signs of  infection.    Nose: Nose normal. No congestion or rhinorrhea.     Mouth/Throat:     Mouth: Mucous membranes are moist.     Pharynx: Oropharynx is clear. No oropharyngeal exudate or posterior oropharyngeal erythema.  Eyes:     General: No scleral icterus.       Right eye: No discharge.        Left eye: No discharge.     Extraocular Movements: Extraocular movements intact.     Conjunctiva/sclera: Conjunctivae normal.     Pupils: Pupils are equal, round, and reactive to light.  Neck:     Vascular: No carotid bruit.  Cardiovascular:     Rate and Rhythm: Normal rate and regular rhythm.     Pulses: Normal pulses.     Heart sounds: Normal heart sounds. No murmur heard.   No friction rub. No gallop.  Pulmonary:     Effort: Pulmonary effort is normal. No respiratory distress.     Breath sounds: Normal breath sounds. No wheezing, rhonchi or rales.  Chest:     Chest wall: No tenderness.  Abdominal:     General: Bowel sounds are normal. There is no distension.     Palpations: Abdomen  is soft. There is no mass.     Tenderness: There is no abdominal tenderness. There is no right CVA tenderness, left CVA tenderness, guarding or rebound.  Musculoskeletal:        General: No swelling or tenderness. Normal range of motion.     Cervical back: Normal range of motion. No rigidity or tenderness.     Right lower leg: No edema.     Left lower leg: No edema.  Lymphadenopathy:     Cervical: No cervical adenopathy.  Skin:    General: Skin is warm and dry.     Coloration: Skin is not pale.     Findings: No bruising, erythema, lesion or rash.  Neurological:     Mental Status: She is alert and oriented to person, place, and time.     Cranial Nerves: No cranial nerve deficit.     Sensory: No sensory deficit.     Motor: No weakness.     Coordination: Coordination normal.     Gait: Gait normal.  Psychiatric:        Mood and Affect: Mood normal.        Speech: Speech normal.        Behavior: Behavior normal.        Thought Content: Thought content normal.        Judgment: Judgment normal.    Labs reviewed: No results for input(s): NA, K, CL, CO2, GLUCOSE, BUN, CREATININE, CALCIUM, MG, PHOS in the last 8760 hours. No results for input(s): AST, ALT, ALKPHOS, BILITOT, PROT, ALBUMIN in the last 8760 hours. No results for input(s): WBC, NEUTROABS, HGB, HCT, MCV, PLT in the last 8760 hours. Lab Results  Component Value Date   TSH 1.20 03/06/2019   No results found for: HGBA1C Lab Results  Component Value Date   CHOL 171 03/06/2019   HDL 53 03/06/2019   LDLCALC 95 03/06/2019   TRIG 129 03/06/2019   CHOLHDL 3.2 03/06/2019    Significant Diagnostic Results in last 30 days:  No results found.  Assessment/Plan 1. Essential hypertension B/p elevated today  - continue on Zestoretic  - Increase Amlodipine from 5 mg tablet to 10 mg tablet one by mouth daily  - Advised to check Blood pressure at home and record on  log provided and notify provider if B/p > 140/90  - CBC with  Differential/Platelet; Future - CMP with eGFR([facility]); Future - TSH; Future  2. Mixed hyperlipidemia LDL at goal  - continue on atorvastatin  - Dietary modification and exercise at least 3 times per week for 30 minutes advised. - additional DASH Eating plan Education information provided on AVS  - Lipid Panel; Future  3. Primary osteoarthritis involving multiple joints Continue on Tylenol PRN   4. Depression, major, single episode, complete remission (HCC) Mood stable  Continue on escitalopram  - TSH; Future  5. Seasonal allergies Continue on OTC zyrtec   6. Breast cancer screening by mammogram Asymptomatic  - MM DIGITAL SCREENING BILATERAL; Future  7. Postmenopausal estrogen deficiency No recent fall or fracture  - DG Bone Density; Future Aware imaging center will call to schedule appointment   8. Need for Tdap vaccination Afebrile  Advised to get Tdap vaccine at the pharmacy  9. Chronic pain of right knee Continue on Tylenol as needed  - DG Knee Complete 4 Views Right; Future - Ambulatory referral to Orthopedic Surgery  10. Colon cancer screening Asymptomatic  - Ambulatory referral to Gastroenterology  11 Bilateral impacted cerumen Bilateral ear cerumen lavaged with warm water and hydrogen peroxide moderate amounts of cerumen obtained.No instrument used Tolerated procedure well.TM clear without any signs of infection.   12. Need for influenza vaccination Afebrile  Flut shot administered by CMA no acute reaction reported.  - Flu Vaccine QUAD High Dose(Fluad)  Family/ staff Communication: Reviewed plan of care with patient verbalized understanding   Labs/tests ordered:  - CBC with Differential/Platelet - CMP with eGFR([facility]) - TSH - Lipid panel - DG Knee Complete 4 Views Right; Future - MM DIGITAL SCREENING BILATERAL; Future - DG Bone Density; Future  Next Appointment : 6 months for medical management of chronic issues.Fasting Labs prior to visit.     Sandrea Hughs, NP

## 2021-02-27 NOTE — Patient Instructions (Addendum)
Please get your Tetanus,Shingles and COVID-19 booster vaccine at your pharmacy   - Increase Amlodipine from 5 mg tablet to 10 mg tablet one by mouth daily   - Advised to check Blood pressure at home and record on log provided and notify provider if B/p > 140/90

## 2021-03-05 ENCOUNTER — Ambulatory Visit: Payer: Medicare Other | Admitting: Family

## 2021-03-10 ENCOUNTER — Ambulatory Visit: Payer: Medicare Other | Admitting: Orthopaedic Surgery

## 2021-04-09 ENCOUNTER — Ambulatory Visit (INDEPENDENT_AMBULATORY_CARE_PROVIDER_SITE_OTHER): Payer: Medicare Other | Admitting: Family

## 2021-04-09 ENCOUNTER — Other Ambulatory Visit: Payer: Self-pay | Admitting: Family

## 2021-04-09 ENCOUNTER — Other Ambulatory Visit: Payer: Self-pay

## 2021-04-09 ENCOUNTER — Encounter: Payer: Self-pay | Admitting: Family

## 2021-04-09 VITALS — BP 138/80 | HR 78 | Temp 97.3°F | Resp 16 | Ht 63.0 in | Wt 220.8 lb

## 2021-04-09 DIAGNOSIS — K047 Periapical abscess without sinus: Secondary | ICD-10-CM

## 2021-04-09 DIAGNOSIS — I10 Essential (primary) hypertension: Secondary | ICD-10-CM

## 2021-04-09 DIAGNOSIS — K0889 Other specified disorders of teeth and supporting structures: Secondary | ICD-10-CM | POA: Diagnosis not present

## 2021-04-09 DIAGNOSIS — E782 Mixed hyperlipidemia: Secondary | ICD-10-CM

## 2021-04-09 DIAGNOSIS — F325 Major depressive disorder, single episode, in full remission: Secondary | ICD-10-CM

## 2021-04-09 DIAGNOSIS — F419 Anxiety disorder, unspecified: Secondary | ICD-10-CM

## 2021-04-09 MED ORDER — AMOXICILLIN-POT CLAVULANATE 875-125 MG PO TABS: 1.0000 | ORAL_TABLET | Freq: Two times a day (BID) | ORAL | 0 refills | Status: DC

## 2021-04-09 NOTE — Patient Instructions (Addendum)
- Please call [facility] Office at Telephone [phone] Address [address] , [place] , [place] [address]  to schedule an appointment as soon as possible for evaluation left upper molar tooth abscess.   Dental Abscess A dental abscess is an infection around a tooth that may involve pain, swelling, and a collection of pus, as well as other symptoms. Treatment is important to help with symptoms and to prevent the infection from spreading. The general types of dental abscesses are: Pulpal abscess. This abscess may form from the inner part of the tooth (pulp). Periodontal abscess. This abscess may form from the gum. What are the causes? This condition is caused by a bacterial infection in or around the tooth. It may result from: Severe tooth decay (cavities). Trauma to the tooth, such as a broken or chipped tooth. What increases the risk? This condition is more likely to develop in males. It is also more likely to develop in people who: Have cavities. Have severe gum disease. Eat sugary snacks between meals. Use tobacco products. Have diabetes. Have a weakened disease-fighting system (immune system). Do not brush and care for their teeth regularly. What are the signs or symptoms? Mild symptoms of this condition include: Tenderness. Bad breath. Fever. A bitter taste in the mouth. Pain in and around the infected tooth. Moderate symptoms of this condition include: Swollen neck glands. Chills. Pus drainage. Swelling and redness around the infected tooth, in the mouth, or in the face. Severe pain in and around the infected tooth. Severe symptoms of this condition include: Difficulty swallowing. Difficulty opening the mouth. Nausea. Vomiting. How is this diagnosed? This condition is diagnosed based on: Your symptoms and your medical and dental history. An examination of the infected tooth. During the exam, your dental care provider may tap on the infected tooth. You  may also need to have X-rays taken of the affected area. How is this treated? This condition is treated by getting rid of the infection. This may be done with: Antibiotic medicines. These may be used in certain situations. Antibacterial mouth rinse. Incision and drainage. This procedure is done by making an incision in the abscess to drain out the pus. Removing pus is the first priority in treating an abscess. A root canal. This may be performed to save the tooth. Your dental care provider accesses the visible part of your tooth (crown) with a drill and removes any infected pulp. Then the space is filled and sealed off. Tooth extraction. The tooth is pulled out if it cannot be saved by other treatment. You may also receive treatment for pain, such as: Acetaminophen or NSAIDs. Gels that contain a numbing medicine. An injection to block the pain near your nerve. Follow these instructions at home: Medicines Take over-the-counter and prescription medicines only as told by your dental care provider. If you were prescribed an antibiotic, take it as told by your dental care provider. Do not stop taking the antibiotic even if you start to feel better. If you were prescribed a gel that contains a numbing medicine, use it exactly as told in the directions. Do not use these gels for children who are younger than 5 years of age. Use an antibacterial mouth rinse as told by your dental care provider. General instructions  Gargle with a mixture of salt and water 3-4 times a day or as needed. To make salt water, completely dissolve -1 tsp (3-6 g) of salt in 1 cup (237 mL) of  warm water. Eat a soft diet while your abscess is healing. Drink enough fluid to keep your urine pale yellow. Do not apply heat to the outside of your mouth. Do not use any products that contain nicotine or tobacco. These products include cigarettes, chewing tobacco, and vaping devices, such as e-cigarettes. If you need help quitting,  ask your dental care provider. Keep all follow-up visits. This is important. How is this prevented?  Excellent dental home care, which includes brushing your teeth every morning and night with fluoride toothpaste. Floss one time each day. Get regularly scheduled dental cleanings. Consider having a dental sealant applied on teeth that have deep grooves to prevent cavities. Drink fluoridated water regularly. This includes most tap water. Check the label on bottled water to see if it contains fluoride. Reduce or eliminate sugary drinks. Eat healthy meals and snacks. Wear a mouth guard or face shield to protect your teeth while playing sports. Contact a health care provider if: Your pain is worse and is not helped by medicine. You have swelling. You see pus around the tooth. You have a fever or chills. Get help right away if: Your symptoms suddenly get worse. You have a very bad headache. You have problems breathing or swallowing. You have trouble opening your mouth. You have swelling in your neck or around your eye. These symptoms may represent a serious problem that is an emergency. Do not wait to see if the symptoms will go away. Get medical help right away. Call your local emergency services (911 in the [place] Do not drive yourself to the hospital. Summary A dental abscess is a collection of pus in or around a tooth that results from an infection. A dental abscess may result from severe tooth decay, trauma to the tooth, or severe gum disease around a tooth. Symptoms include severe pain, swelling, redness, and drainage of pus in and around the infected tooth. The first priority in treating a dental abscess is to drain out the pus. Treatment may also involve removing damage inside the tooth (root canal) or extracting the tooth. This information is not intended to replace advice given to you by your health care provider. Make sure you discuss any questions you have with your health care  provider. Document Revised: 06/26/2020 Document Reviewed: 06/26/2020 Elsevier Patient Education  2022 [employer].

## 2021-04-09 NOTE — Progress Notes (Signed)
Provider: Richarda Blade FNP-C  Patricia Perales, Donalee Citrin, NP  Patient Care Team: Devantae Babe, Donalee Citrin, NP as PCP - General (Family Medicine)  Extended Emergency Contact Information Primary Emergency Contact: Cranshaw,Nkechi Address: [address]           [place], [place] [address] Darden [place] of [place] Home Phone: [phone] Mobile Phone: [phone] Relation: Daughter  Code Status:  Full Code  Goals of care: Advanced Directive information Advanced Directives 04/09/2021  Does Patient Have a Medical Advance Directive? No  Does patient want to make changes to medical advance directive? -  Would patient like information on creating a medical advance directive? No - Patient declined     Chief Complaint  Patient presents with   Acute Visit    Patient complains of tooth pain.    HPI:  Pt is a 70 y.o. female seen today for an acute visit for evaluation of tooth pain on and off for several months.Has been swollen and achy.Has taken ibprofen. No fever or chills.Denies any drainage. Has been rising with mouth wash.states since she retired has not seen the dentist.  She was advised on her last visit to follow up for her B/p which was elevated but did not follow up.Her amlodipine was increased from 5 to 10 mg tablet daily.reports no side effects.Has been checking B/p at home as directed.Brought in her B/p log today readings ranging in the 120's/70's - 140's/80's ,HR 70's - 80's  Her B/p reading today is within target range. denies any headache,dizziness,vision changes,fatigue,chest tightness,palpitation,chest pain or shortness of breath.       Past Medical History:  Diagnosis Date   Anxiety    Arthritis    Benign essential HTN 08/04/2015   Bilateral knee pain    With Osteoarthritis, Per records received from [facility]    Depression    Heart murmur    hx of    Hypercholesterolemia    Per records received from [facility]    Hyperlipidemia    Hypertension    Major  depressive episode    Per records received from [facility]    Obesity    Per records received from [facility]    Plantar fasciitis    Per records received from [facility]    PONV (postoperative nausea and vomiting)    1 time   Past Surgical History:  Procedure Laterality Date   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   amLODipine (NORVASC) 10 MG tablet Take 1 tablet (10 mg total) by mouth daily.   atorvastatin (LIPITOR) 40 MG tablet TAKE 1 TABLET BY MOUTH  DAILY   Cetirizine HCl 10 MG CAPS Take 1 capsule by mouth as needed.    escitalopram (LEXAPRO) 20 MG tablet TAKE 1 TABLET BY MOUTH  DAILY   lisinopril-hydrochlorothiazide (ZESTORETIC) 20-12.5 MG tablet Take 2 tablets by mouth daily.   Tdap (BOOSTRIX) 5-2.5-18.5 LF-MCG/0.5 injection Inject 0.5 mLs into the muscle once.   No facility-administered encounter medications on file  as of 04/09/2021.    Review of Systems  Constitutional:  Negative for appetite change, chills, fatigue and fever.  HENT:  Positive for dental problem. Negative for congestion, rhinorrhea, sinus pressure, sinus pain, sneezing and sore throat.   Eyes:  Negative for pain, discharge, redness and itching.  Respiratory:  Negative for cough, chest tightness, shortness of breath and wheezing.   Cardiovascular:  Negative for chest pain, palpitations and leg swelling.  Gastrointestinal:  Negative for abdominal distention, abdominal pain,  constipation, diarrhea, nausea and vomiting.  Skin:  Negative for color change, pallor and rash.  Neurological:  Negative for dizziness, weakness, light-headedness and headaches.   Immunization History  Administered Date(s) Administered   Fluad Quad(high Dose 65+) 03/06/2019, 02/27/2021   Pneumococcal Conjugate-13 11/12/2016   Pneumococcal Polysaccharide-23 03/06/2019   Pertinent  Health Maintenance Due  Topic Date Due   COLONOSCOPY (Pts 45-49yrs Insurance coverage will need to be confirmed)  Never done   MAMMOGRAM  Never done   DEXA SCAN  Never done   INFLUENZA VACCINE  Completed   Fall Risk 01/30/2019 03/06/2019 10/15/2019 02/27/2021 04/09/2021  Falls in the past year? 0 0 1 0 1  Was there an injury with Fall? 0 0 1 0 0  Fall Risk Category Calculator 0 0 2 0 1  Fall Risk Category Low Low Moderate Low Low  Patient Fall Risk Level Low fall risk Low fall risk Moderate fall risk Low fall risk Low fall risk  Patient at Risk for Falls Due to - - - No Fall Risks History of fall(s)  Fall risk Follow up - - - Falls evaluation completed Falls evaluation completed;Education provided;Falls prevention discussed   Functional Status Survey:    Vitals:   04/09/21 0855  BP: 138/80  Pulse: 78  Resp: 16  Temp: (!) 97.3 F (36.3 C)  SpO2: 97%  Weight: 220 lb 12.8 oz (100.2 kg)  Height: 5\' 3"  (1.6 m)   Body mass index is 39.11 kg/m. Physical Exam Constitutional:      General: She is not in acute distress.    Appearance: She is not ill-appearing.  HENT:     Head: Normocephalic.     Nose: Nose normal. No congestion or rhinorrhea.     Mouth/Throat:     Mouth: Mucous membranes are moist.     Dentition: Dental tenderness, dental caries and dental abscesses present.     Pharynx: Oropharynx is clear. No oropharyngeal exudate or posterior oropharyngeal erythema.   Neurological:     Mental Status: She is alert.    Labs reviewed: No results for input(s): NA, K, CL, CO2, GLUCOSE, BUN,  CREATININE, CALCIUM, MG, PHOS in the last 8760 hours. No results for input(s): AST, ALT, ALKPHOS, BILITOT, PROT, ALBUMIN in the last 8760 hours. No results for input(s): WBC, NEUTROABS, HGB, HCT, MCV, PLT in the last 8760 hours. Lab Results  Component Value Date   TSH 1.20 03/06/2019   No results found for: HGBA1C Lab Results  Component Value Date   CHOL 171 03/06/2019   HDL 53 03/06/2019   LDLCALC 95 03/06/2019   TRIG 129 03/06/2019   CHOLHDL 3.2 03/06/2019    Significant Diagnostic Results in last 30 days:  No results found.  Assessment/Plan  1. Toothache - left upper molar abscess and erythema noted.Recommended follow up with dentist for further evaluation.Has no dentist since she retired several yrs ago. Requested reccommended.Staff provided 13/07/2018 at Telephone [phone] Address [address] , [place] , [place] [address]  to schedule an appointment as soon as possible for evaluation left upper molar tooth abscess.Information highlighted on AVS  - amoxicillin-clavulanate (AUGMENTIN) 875-125 MG tablet; Take 1 tablet by mouth 2 (two) times daily.  Dispense: 20 tablet; Refill: 0  2. Tooth abscess Afebrile  - left upper molar erythema and swelling with abscess noted. Dental office number and address provided as above to call and make appointment as soon as possible. Will start on Augmentin as below.side effects discussed.Advised to take OTC probiotic.   - amoxicillin-clavulanate (AUGMENTIN) 875-125 MG tablet; Take 1 tablet by mouth 2 (two) times daily.  Dispense: 20 tablet; Refill: 0  3. Essential hypertension B/p readings within target goal  - continue on Amlodipine and Lisinopril - HCZT - continue to monitor B/p at home and notify provider if > 140/90 consistently.   Family/ staff Communication: Reviewed plan of care with patient verbalized understanding.   Labs/tests ordered: None   Next Appointment: As needed if symptoms worsen or fail to  improve    Caesar Bookman, NP

## 2021-04-16 ENCOUNTER — Other Ambulatory Visit: Payer: Medicare Other

## 2021-04-16 ENCOUNTER — Inpatient Hospital Stay: Admission: RE | Admit: 2021-04-16 | Payer: Medicare Other | Source: Ambulatory Visit

## 2021-04-23 ENCOUNTER — Inpatient Hospital Stay: Admission: RE | Admit: 2021-04-23 | Payer: Medicare Other | Source: Ambulatory Visit

## 2021-06-22 ENCOUNTER — Telehealth: Payer: Self-pay

## 2021-06-22 NOTE — Telephone Encounter (Signed)
Please keep current 6 months appointment and patient need to schedule lab appointment too.Has not had recent lab work and need to F/u blood pressure.  May schedule AWV for another visit.

## 2021-06-22 NOTE — Telephone Encounter (Addendum)
Tammy [name]. with [employer] [phone]) requests to change the patient's 6 Month follow up to an AWV.  Patient last seen in October 2022  To Dinah to approve.

## 2021-06-23 NOTE — Telephone Encounter (Signed)
Called patient. No answer. LMOM to return call to schedule lab apnt and AWV.

## 2021-06-25 NOTE — Telephone Encounter (Signed)
Called patient. No answer. LMOM to return call to schedule lab apnt and AWV

## 2021-09-01 ENCOUNTER — Other Ambulatory Visit: Payer: Self-pay | Admitting: Family

## 2021-09-01 DIAGNOSIS — I10 Essential (primary) hypertension: Secondary | ICD-10-CM

## 2021-09-10 ENCOUNTER — Ambulatory Visit: Payer: Medicare Other | Admitting: Family

## 2021-09-22 ENCOUNTER — Encounter: Payer: Self-pay | Admitting: Family

## 2021-09-22 ENCOUNTER — Ambulatory Visit (INDEPENDENT_AMBULATORY_CARE_PROVIDER_SITE_OTHER): Payer: Medicare Other | Admitting: Family

## 2021-09-22 VITALS — BP 126/70 | HR 74 | Temp 97.5°F | Resp 18 | Ht 63.0 in | Wt 222.3 lb

## 2021-09-22 DIAGNOSIS — Z23 Encounter for immunization: Secondary | ICD-10-CM

## 2021-09-22 DIAGNOSIS — I10 Essential (primary) hypertension: Secondary | ICD-10-CM

## 2021-09-22 DIAGNOSIS — G8929 Other chronic pain: Secondary | ICD-10-CM | POA: Diagnosis not present

## 2021-09-22 DIAGNOSIS — F325 Major depressive disorder, single episode, in full remission: Secondary | ICD-10-CM

## 2021-09-22 DIAGNOSIS — Z1211 Encounter for screening for malignant neoplasm of colon: Secondary | ICD-10-CM

## 2021-09-22 DIAGNOSIS — L109 Pemphigus, unspecified: Secondary | ICD-10-CM | POA: Diagnosis not present

## 2021-09-22 DIAGNOSIS — M25561 Pain in right knee: Secondary | ICD-10-CM

## 2021-09-22 DIAGNOSIS — Z1231 Encounter for screening mammogram for malignant neoplasm of breast: Secondary | ICD-10-CM | POA: Diagnosis not present

## 2021-09-22 DIAGNOSIS — E782 Mixed hyperlipidemia: Secondary | ICD-10-CM | POA: Diagnosis not present

## 2021-09-22 DIAGNOSIS — Z780 Asymptomatic menopausal state: Secondary | ICD-10-CM

## 2021-09-22 MED ORDER — TETANUS-DIPHTH-ACELL PERTUSSIS 5-2.5-18.5 LF-MCG/0.5 IM SUSP: 0.5000 mL | Freq: Once | INTRAMUSCULAR | 0 refills | Status: AC

## 2021-09-22 MED ORDER — AMLODIPINE BESYLATE 10 MG PO TABS: ORAL_TABLET | ORAL | 1 refills | Status: DC

## 2021-09-22 MED ORDER — LISINOPRIL-HYDROCHLOROTHIAZIDE 20-12.5 MG PO TABS: 2.0000 | ORAL_TABLET | Freq: Every day | ORAL | 1 refills | Status: DC

## 2021-09-22 NOTE — Progress Notes (Signed)
Provider: Marlowe Sax FNP-C   Leeta Grimme, Nelda Bucks, NP  Patient Care Team: Jerran Tappan, Nelda Bucks, NP as PCP - General (Family Medicine)  Extended Emergency Contact Information Primary Emergency Contact: Tindall,Nkechi Address: [address]           [place], [place] [address] Johnnette Litter of [place] Phone: [phone] Mobile Phone: [phone] Relation: Daughter  Code Status:  Full Code  Goals of care: Advanced Directive information    09/22/2021    9:44 AM  Advanced Directives  Does Patient Have a Medical Advance Directive? No  Would patient like information on creating a medical advance directive? No - Patient declined     Chief Complaint  Patient presents with   Medical Management of Chronic Issues    6 month follow up.   Health Maintenance    Discuss the need for Mammogram, Dexa scan, and Colonoscopy.    Immunizations    Discuss the need for Covid vaccine, Tetanus vaccine, and Shingrix vaccine.    HPI:  Pt is a 71 y.o. female seen today for 6 months follow-up for medical management of chronic diseases.    Hypertension - B/p running in the 150's prior to taking medication and SBP 120's.on lisinopril-hydrochlorothiazide 20-12.5 mg daily and amlodipine 10 mg daily.  Denies any headache,dizziness,vision changes,fatigue,chest tightness,palpitation,chest pain or shortness of breath.  She continues to have stress level caring for her grandchildren who have disability.  States one child started school so that has helped.      Depression - still stressed out as above.Lexapro continues to help.  No suicidal thoughts, injury to self or others. Health maintenance: Due for mammogram and bone density this was ordered in previous visit but due to caring for grandchildren disability she has not been able to schedule appointments. Colonoscopy requests Cologuard instead of colonoscopy. Tetanus vaccine and Shingrix aware to get vaccines at the pharmacy. Also due for  COVID-vaccine   Past Medical History:  Diagnosis Date   Anxiety    Arthritis    Benign essential HTN 08/04/2015   Bilateral knee pain    With Osteoarthritis, Per records received from [facility]    Depression    Heart murmur    hx of    Hypercholesterolemia    Per records received from [facility]    Hyperlipidemia    Hypertension    Major depressive episode    Per records received from [facility]    Obesity    Per records received from [facility]    Plantar fasciitis    Per records received from [facility]    PONV (postoperative nausea and vomiting)    1 time   Past Surgical History:  Procedure Laterality Date   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 If you have any questions, ask your nurse or doctor.          STOP taking these medications    amoxicillin-clavulanate 875-125 MG tablet Commonly known as:  AUGMENTIN Stopped by: Dinah C Ngetich, NP   Tdap 5-2.5-18.5 LF-MCG/0.5 injection Commonly known as: BOOSTRIX Stopped by: Dinah C Ngetich, NP       TAKE these medications    acetaminophen 500 MG tablet Commonly known as: TYLENOL Take 500 mg by mouth every 6 (six) hours as needed.   amLODipine 10 MG tablet Commonly known as: NORVASC TAKE 1 TABLET(10 MG) BY MOUTH DAILY   atorvastatin 40 MG tablet Commonly known as: LIPITOR TAKE 1 TABLET BY MOUTH  DAILY   Cetirizine HCl 10 MG Caps Take 1 capsule by mouth as needed.   escitalopram 20 MG  tablet Commonly known as: LEXAPRO TAKE 1 TABLET BY MOUTH  DAILY   lisinopril-hydrochlorothiazide 20-12.5 MG tablet Commonly known as: ZESTORETIC TAKE 2 TABLETS BY MOUTH DAILY        Review of Systems  Constitutional:  Negative for appetite change, chills, fatigue, fever and unexpected weight change.  HENT:  Negative for congestion, dental problem, ear discharge, ear pain, facial swelling, hearing loss, nosebleeds, postnasal drip, rhinorrhea, sinus pressure, sinus pain, sneezing, sore throat, tinnitus and trouble swallowing.   Eyes:  Negative for pain, discharge, redness, itching and visual disturbance.  Respiratory:  Negative for cough, chest tightness, shortness of breath and wheezing.   Cardiovascular:  Negative for chest pain, palpitations and leg swelling.  Gastrointestinal:  Negative for abdominal distention, abdominal pain, blood in stool, constipation, diarrhea, nausea and vomiting.  Endocrine: Negative for cold intolerance, heat intolerance, polydipsia, polyphagia and polyuria.  Genitourinary:  Negative for difficulty urinating, dysuria, flank pain, frequency and urgency.  Musculoskeletal:  Negative for arthralgias, back pain, gait problem, joint swelling, myalgias, neck pain and neck stiffness.  Skin:  Negative for color change, pallor, rash and wound.  Neurological:  Negative for dizziness, syncope, speech difficulty, weakness, light-headedness, numbness and headaches.  Hematological:  Does not bruise/bleed easily.  Psychiatric/Behavioral:  Negative for agitation, behavioral problems, confusion, hallucinations, self-injury, sleep disturbance and suicidal ideas. The patient is not nervous/anxious.    Immunization History  Administered Date(s) Administered   Fluad Quad(high Dose 65+) 03/06/2019, 02/27/2021   Pneumococcal Conjugate-13 11/12/2016   Pneumococcal Polysaccharide-23 03/06/2019   Pertinent  Health Maintenance Due  Topic Date Due   COLONOSCOPY (Pts 45-49yrs  Insurance coverage will need to be confirmed)  Never done   MAMMOGRAM  Never done   DEXA SCAN  Never done   INFLUENZA VACCINE  12/01/2021      03/06/2019    9:11 AM 10/15/2019    9:48 AM 02/27/2021    1:28 PM 04/09/2021    8:46 AM 09/22/2021    9:43 AM  Fall Risk  Falls in the past year? 0 1 0 1 0  Was there an injury with Fall? 0 1 0 0 0  Fall Risk Category Calculator 0 2 0 1 0  Fall Risk Category Low Moderate Low Low Low  Patient Fall Risk Level Low fall risk Moderate fall risk Low fall risk Low fall risk Low fall risk  Patient at Risk for Falls Due to   No Fall Risks History of fall(s) No Fall Risks  Fall risk Follow up   Falls evaluation completed Falls evaluation completed;Education provided;Falls prevention discussed Falls evaluation completed   Functional Status Survey:    Vitals:   09/22/21 0941  BP: 126/70  Pulse: 74  Resp: 18  Temp: (!) 97.5 F (36.4 C)  SpO2: 96%  Weight: 222 lb 4.8 oz (100.8 kg)  Height: 5' 3" (1.6 m)     Body mass index is 39.38 kg/m. Physical Exam Vitals reviewed.  Constitutional:      General: She is not in acute distress.    Appearance: Normal appearance. She is obese. She is not ill-appearing or diaphoretic.  HENT:     Head: Normocephalic.     Right Ear: Tympanic membrane, ear canal and external ear normal. There is no impacted cerumen.     Left Ear: Tympanic membrane, ear canal and external ear normal. There is no impacted cerumen.     Nose: Nose normal. No congestion or rhinorrhea.     Mouth/Throat:     Mouth: Mucous membranes are moist.     Pharynx: Oropharynx is clear. No oropharyngeal exudate or posterior oropharyngeal erythema.  Eyes:     General: No scleral icterus.       Right eye: No discharge.        Left eye: No discharge.     Extraocular Movements: Extraocular movements intact.     Conjunctiva/sclera: Conjunctivae normal.     Pupils: Pupils are equal, round, and reactive to light.  Neck:     Vascular: No carotid bruit.   Cardiovascular:     Rate and Rhythm: Normal rate and regular rhythm.     Pulses: Normal pulses.     Heart sounds: Normal heart sounds. No murmur heard.   No friction rub. No gallop.  Pulmonary:     Effort: Pulmonary effort is normal. No respiratory distress.     Breath sounds: Normal breath sounds. No wheezing, rhonchi or rales.  Chest:     Chest wall: No tenderness.  Abdominal:     General: Bowel sounds are normal. There is no distension.     Palpations: Abdomen is soft. There is no mass.     Tenderness: There is no abdominal tenderness. There is no right CVA tenderness, left CVA tenderness, guarding or rebound.  Musculoskeletal:        General: No swelling or tenderness. Normal range of motion.     Cervical back: Normal range of motion. No rigidity or tenderness.     Right lower leg: No edema.     Left lower leg: No edema.  Lymphadenopathy:     Cervical: No cervical adenopathy.  Skin:    General: Skin is warm and dry.     Coloration: Skin is not pale.     Findings: No bruising, erythema, lesion or rash.  Neurological:     Mental Status: She is alert and oriented to person, place, and time.     Cranial Nerves: No cranial nerve deficit.     Sensory: No sensory deficit.     Motor: No weakness.     Coordination: Coordination normal.     Gait: Gait normal.  Psychiatric:        Mood and Affect: Mood normal.        Speech: Speech normal.        Behavior: Behavior normal.        Thought Content: Thought content normal.        Judgment: Judgment normal.    Labs reviewed: No results for input(s): NA, K, CL, CO2, GLUCOSE, BUN, CREATININE, CALCIUM, MG, PHOS in the last 8760 hours. No results for input(s): AST, ALT, ALKPHOS, BILITOT, PROT, ALBUMIN in the last 8760 hours. No results for input(s): WBC, NEUTROABS, HGB, HCT, MCV, PLT in the last 8760 hours. Lab Results  Component Value Date   TSH 1.20 03/06/2019   No results found for: HGBA1C Lab   Results  Component Value Date    CHOL 171 03/06/2019   HDL 53 03/06/2019   LDLCALC 95 03/06/2019   TRIG 129 03/06/2019   CHOLHDL 3.2 03/06/2019    Significant Diagnostic Results in last 30 days:  No results found.  Assessment/Plan 1. Essential hypertension Blood pressure well controlled Continue on amlodipine and lisinopril-hydrochlorothiazide - amLODipine (NORVASC) 10 MG tablet; TAKE 1 TABLET(10 MG) BY MOUTH DAILY  Dispense: 90 tablet; Refill: 1 - lisinopril-hydrochlorothiazide (ZESTORETIC) 20-12.5 MG tablet; Take 2 tablets by mouth daily.  Dispense: 180 tablet; Refill: 1 - TSH - CMP with eGFR([facility]) - CBC with Differential/Platelet  2. Screening for colon cancer As symptomatic Discussed option for colonoscopy versus Cologuard prefers Cologuard. - Cologuard  3. Postmenopausal estrogen deficiency No recent fall or fractures - DG Bone Density; Future  4. Breast cancer screening by mammogram Reports no symptoms - MM DIGITAL SCREENING BILATERAL; Future  5. Need for Tdap vaccination Advised to get Tdap vaccine at the pharmacy. Script send to pharmacy today - Tdap ([place]) 5-2.5-18.5 LF-MCG/0.5 injection; Inject 0.5 mLs into the muscle once for 1 dose.  Dispense: 0.5 mL; Refill: 0  6. Chronic pain of right knee Worsening knee pain -Continue on over-the-counter analgesic and exercise.Will obtain imaging then referred to orthopedic if indicated - DG Knee Complete 4 Views Right  7. Depression, major, single episode, complete remission (HCC) Mood stable Continue on Lexapro -Advised to use nonpharmacological measures and relaxation due to stress from her grandchildren with disability - TSH  8. Mixed hyperlipidemia Previous LDL at goal -Continue on dietary modification and increase physical activity. - Lipid Panel  Family/ staff Communication: Reviewed plan of care with patient verbalized understanding  Labs/tests ordered:  - CBC with Differential/Platelet - CMP with eGFR([facility]) - TSH - Lipid  panel - DG Knee Complete 4 Views Right -  DG Bone Density; Future - MM DIGITAL SCREENING BILATERAL; Future  Next Appointment : 6 months for medical management of chronic issues.  Sandrea Hughs, NP

## 2021-09-23 LAB — CBC WITH DIFFERENTIAL/PLATELET
Absolute Monocytes: 592 cells/uL (ref 200–950)
Basophils Absolute: 82 cells/uL (ref 0–200)
Basophils Relative: 0.9 %
Eosinophils Absolute: 100 cells/uL (ref 15–500)
Eosinophils Relative: 1.1 %
HCT: 38.8 % (ref 35.0–45.0)
Hemoglobin: 13.1 g/dL (ref 11.7–15.5)
Lymphs Abs: 2903 cells/uL (ref 850–3900)
MCH: 29.8 pg (ref 27.0–33.0)
MCHC: 33.8 g/dL (ref 32.0–36.0)
MCV: 88.2 fL (ref 80.0–100.0)
MPV: 10.9 fL (ref 7.5–12.5)
Monocytes Relative: 6.5 %
Neutro Abs: 5424 cells/uL (ref 1500–7800)
Neutrophils Relative %: 59.6 %
Platelets: 363 10*3/uL (ref 140–400)
RBC: 4.4 10*6/uL (ref 3.80–5.10)
RDW: 13.3 % (ref 11.0–15.0)
Total Lymphocyte: 31.9 %
WBC: 9.1 10*3/uL (ref 3.8–10.8)

## 2021-09-23 LAB — COMPLETE METABOLIC PANEL WITH GFR
AG Ratio: 1.6 (calc) (ref 1.0–2.5)
ALT: 17 U/L (ref 6–29)
AST: 21 U/L (ref 10–35)
Albumin: 4.5 g/dL (ref 3.6–5.1)
Alkaline phosphatase (APISO): 94 U/L (ref 37–153)
BUN/Creatinine Ratio: 28 (calc) — ABNORMAL HIGH (ref 6–22)
BUN: 16 mg/dL (ref 7–25)
CO2: 25 mmol/L (ref 20–32)
Calcium: 9.8 mg/dL (ref 8.6–10.4)
Chloride: 105 mmol/L (ref 98–110)
Creat: 0.57 mg/dL — ABNORMAL LOW (ref 0.60–1.00)
Globulin: 2.9 g/dL (calc) (ref 1.9–3.7)
Glucose, Bld: 77 mg/dL (ref 65–139)
Potassium: 4 mmol/L (ref 3.5–5.3)
Sodium: 139 mmol/L (ref 135–146)
Total Bilirubin: 0.8 mg/dL (ref 0.2–1.2)
Total Protein: 7.4 g/dL (ref 6.1–8.1)
eGFR: 98 mL/min/{1.73_m2} (ref >=60)

## 2021-09-23 LAB — LIPID PANEL
Cholesterol: 190 mg/dL (ref <200)
HDL: 65 mg/dL (ref >=50)
LDL Cholesterol (Calc): 103 mg/dL (calc) — ABNORMAL HIGH
Non-HDL Cholesterol (Calc): 125 mg/dL (calc) (ref <130)
Total CHOL/HDL Ratio: 2.9 (calc) (ref <5.0)
Triglycerides: 120 mg/dL (ref <150)

## 2021-09-23 LAB — TSH: TSH: 1.11 mIU/L (ref 0.40–4.50)

## 2021-11-26 ENCOUNTER — Other Ambulatory Visit: Payer: Self-pay | Admitting: Family

## 2021-11-26 DIAGNOSIS — I10 Essential (primary) hypertension: Secondary | ICD-10-CM

## 2021-12-29 ENCOUNTER — Other Ambulatory Visit: Payer: Self-pay | Admitting: Family

## 2021-12-29 DIAGNOSIS — E782 Mixed hyperlipidemia: Secondary | ICD-10-CM

## 2022-03-04 ENCOUNTER — Other Ambulatory Visit: Payer: Self-pay | Admitting: Family

## 2022-03-04 DIAGNOSIS — F419 Anxiety disorder, unspecified: Secondary | ICD-10-CM

## 2022-03-04 DIAGNOSIS — F325 Major depressive disorder, single episode, in full remission: Secondary | ICD-10-CM

## 2022-03-29 ENCOUNTER — Encounter: Payer: Medicare Other | Admitting: Family

## 2022-03-29 NOTE — Progress Notes (Signed)
  This encounter was created in error - please disregard. No show 

## 2022-04-02 ENCOUNTER — Other Ambulatory Visit: Payer: Self-pay | Admitting: Family

## 2022-04-02 DIAGNOSIS — I10 Essential (primary) hypertension: Secondary | ICD-10-CM

## 2022-04-02 DIAGNOSIS — E782 Mixed hyperlipidemia: Secondary | ICD-10-CM

## 2022-04-06 ENCOUNTER — Telehealth: Payer: Self-pay | Admitting: *Deleted

## 2022-04-06 NOTE — Telephone Encounter (Signed)
Tammy [name], It has been a pleasure knowing you and taking care of you.will miss you.Wish you all the best. Let us know if you need any of your medication refilled until you see your new Primary Care Provider.

## 2022-04-06 NOTE — Telephone Encounter (Signed)
Patient called and stated that she wanted to let you know that she has MOVED to [facility] [place]  Stated that she wanted to [facility] for all your care and she is going to Miss you.   FYI

## 2022-06-07 DIAGNOSIS — Z1231 Encounter for screening mammogram for malignant neoplasm of breast: Secondary | ICD-10-CM | POA: Diagnosis not present

## 2022-06-07 DIAGNOSIS — Z0000 Encounter for general adult medical examination without abnormal findings: Secondary | ICD-10-CM | POA: Diagnosis not present

## 2022-06-07 DIAGNOSIS — I10 Essential (primary) hypertension: Secondary | ICD-10-CM | POA: Diagnosis not present

## 2022-06-07 DIAGNOSIS — M545 Low back pain, unspecified: Secondary | ICD-10-CM | POA: Diagnosis not present

## 2022-06-07 DIAGNOSIS — Z9109 Other allergy status, other than to drugs and biological substances: Secondary | ICD-10-CM | POA: Diagnosis not present

## 2022-06-07 DIAGNOSIS — Z780 Asymptomatic menopausal state: Secondary | ICD-10-CM | POA: Diagnosis not present

## 2022-06-07 DIAGNOSIS — R21 Rash and other nonspecific skin eruption: Secondary | ICD-10-CM | POA: Diagnosis not present

## 2022-08-27 ENCOUNTER — Other Ambulatory Visit: Payer: Self-pay | Admitting: Family

## 2022-08-27 DIAGNOSIS — F325 Major depressive disorder, single episode, in full remission: Secondary | ICD-10-CM

## 2022-08-27 DIAGNOSIS — F419 Anxiety disorder, unspecified: Secondary | ICD-10-CM

## 2022-12-18 ENCOUNTER — Other Ambulatory Visit: Payer: Self-pay | Admitting: Family

## 2022-12-18 DIAGNOSIS — E782 Mixed hyperlipidemia: Secondary | ICD-10-CM

## 2023-03-11 ENCOUNTER — Other Ambulatory Visit: Payer: Self-pay | Admitting: Family

## 2023-03-11 DIAGNOSIS — F419 Anxiety disorder, unspecified: Secondary | ICD-10-CM

## 2023-03-11 DIAGNOSIS — F325 Major depressive disorder, single episode, in full remission: Secondary | ICD-10-CM
